# Patient Record
Sex: Female | Born: 1935 | Race: White | Hispanic: No | Smoking: Current every day smoker
Health system: Southern US, Community
[De-identification: ages and names within clinical notes are randomized; demographics above are authoritative.]

## PROBLEM LIST (undated history)

## (undated) DIAGNOSIS — M810 Age-related osteoporosis without current pathological fracture: Secondary | ICD-10-CM

## (undated) DIAGNOSIS — F1910 Other psychoactive substance abuse, uncomplicated: Secondary | ICD-10-CM

## (undated) DIAGNOSIS — I10 Essential (primary) hypertension: Secondary | ICD-10-CM

## (undated) DIAGNOSIS — N189 Chronic kidney disease, unspecified: Secondary | ICD-10-CM

## (undated) DIAGNOSIS — J449 Chronic obstructive pulmonary disease, unspecified: Secondary | ICD-10-CM

## (undated) DIAGNOSIS — F329 Major depressive disorder, single episode, unspecified: Secondary | ICD-10-CM

## (undated) DIAGNOSIS — R627 Adult failure to thrive: Secondary | ICD-10-CM

## (undated) DIAGNOSIS — I219 Acute myocardial infarction, unspecified: Secondary | ICD-10-CM

## (undated) DIAGNOSIS — I2510 Atherosclerotic heart disease of native coronary artery without angina pectoris: Secondary | ICD-10-CM

## (undated) DIAGNOSIS — F0280 Dementia in other diseases classified elsewhere without behavioral disturbance: Secondary | ICD-10-CM

## (undated) DIAGNOSIS — G309 Alzheimer's disease, unspecified: Secondary | ICD-10-CM

## (undated) DIAGNOSIS — F32A Depression, unspecified: Secondary | ICD-10-CM

## (undated) DIAGNOSIS — E119 Type 2 diabetes mellitus without complications: Secondary | ICD-10-CM

## (undated) DIAGNOSIS — C801 Malignant (primary) neoplasm, unspecified: Secondary | ICD-10-CM

## (undated) DIAGNOSIS — K219 Gastro-esophageal reflux disease without esophagitis: Secondary | ICD-10-CM

## (undated) DIAGNOSIS — D649 Anemia, unspecified: Secondary | ICD-10-CM

## (undated) HISTORY — DX: Age-related osteoporosis without current pathological fracture: M81.0

## (undated) HISTORY — DX: Atherosclerotic heart disease of native coronary artery without angina pectoris: I25.10

## (undated) HISTORY — DX: Major depressive disorder, single episode, unspecified: F32.9

## (undated) HISTORY — DX: Adult failure to thrive: R62.7

## (undated) HISTORY — DX: Alzheimer's disease, unspecified: G30.9

## (undated) HISTORY — DX: Other psychoactive substance abuse, uncomplicated: F19.10

## (undated) HISTORY — DX: Chronic obstructive pulmonary disease, unspecified: J44.9

## (undated) HISTORY — DX: Chronic kidney disease, unspecified: N18.9

## (undated) HISTORY — DX: Depression, unspecified: F32.A

## (undated) HISTORY — DX: Anemia, unspecified: D64.9

## (undated) HISTORY — DX: Essential (primary) hypertension: I10

## (undated) HISTORY — DX: Type 2 diabetes mellitus without complications: E11.9

## (undated) HISTORY — DX: Dementia in other diseases classified elsewhere without behavioral disturbance: F02.80

## (undated) HISTORY — DX: Gastro-esophageal reflux disease without esophagitis: K21.9

## (undated) HISTORY — DX: Malignant (primary) neoplasm, unspecified: C80.1

## (undated) HISTORY — DX: Acute myocardial infarction, unspecified: I21.9

## (undated) HISTORY — PX: HERNIA REPAIR: SHX51

---

## 1998-07-16 ENCOUNTER — Emergency Department (HOSPITAL_COMMUNITY): Admission: EM | Admit: 1998-07-16 | Discharge: 1998-07-16 | Payer: Self-pay | Admitting: Emergency Medicine

## 1998-07-16 ENCOUNTER — Encounter: Payer: Self-pay | Admitting: Emergency Medicine

## 1999-01-05 ENCOUNTER — Encounter: Payer: Self-pay | Admitting: Emergency Medicine

## 1999-01-05 ENCOUNTER — Emergency Department (HOSPITAL_COMMUNITY): Admission: EM | Admit: 1999-01-05 | Discharge: 1999-01-05 | Payer: Self-pay | Admitting: Emergency Medicine

## 2001-09-12 ENCOUNTER — Inpatient Hospital Stay (HOSPITAL_COMMUNITY): Admission: EM | Admit: 2001-09-12 | Discharge: 2001-09-20 | Payer: Self-pay

## 2002-04-13 ENCOUNTER — Inpatient Hospital Stay (HOSPITAL_COMMUNITY): Admission: RE | Admit: 2002-04-13 | Discharge: 2002-04-18 | Payer: Self-pay | Admitting: Orthopaedic Surgery

## 2006-12-13 ENCOUNTER — Ambulatory Visit: Payer: Self-pay | Admitting: Cardiology

## 2006-12-13 ENCOUNTER — Inpatient Hospital Stay (HOSPITAL_COMMUNITY): Admission: EM | Admit: 2006-12-13 | Discharge: 2006-12-18 | Payer: Self-pay | Admitting: Emergency Medicine

## 2008-10-17 ENCOUNTER — Emergency Department (HOSPITAL_COMMUNITY): Admission: EM | Admit: 2008-10-17 | Discharge: 2008-10-17 | Payer: Self-pay | Admitting: Emergency Medicine

## 2009-02-17 ENCOUNTER — Ambulatory Visit: Payer: Self-pay | Admitting: Gastroenterology

## 2009-02-18 ENCOUNTER — Inpatient Hospital Stay (HOSPITAL_COMMUNITY): Admission: EM | Admit: 2009-02-18 | Discharge: 2009-02-21 | Payer: Self-pay | Admitting: Emergency Medicine

## 2009-02-28 ENCOUNTER — Ambulatory Visit: Payer: Self-pay | Admitting: Radiology

## 2009-02-28 ENCOUNTER — Inpatient Hospital Stay (HOSPITAL_COMMUNITY): Admission: EM | Admit: 2009-02-28 | Discharge: 2009-03-05 | Payer: Self-pay | Admitting: Internal Medicine

## 2009-02-28 ENCOUNTER — Encounter: Payer: Self-pay | Admitting: Emergency Medicine

## 2009-03-18 ENCOUNTER — Inpatient Hospital Stay (HOSPITAL_COMMUNITY): Admission: EM | Admit: 2009-03-18 | Discharge: 2009-03-23 | Payer: Self-pay | Admitting: Emergency Medicine

## 2009-03-18 ENCOUNTER — Ambulatory Visit: Payer: Self-pay | Admitting: Internal Medicine

## 2009-03-20 ENCOUNTER — Encounter: Payer: Self-pay | Admitting: Internal Medicine

## 2010-05-27 IMAGING — CR DG CHEST 2V
2 series · 2 of 2 positions shown · non-contrast
Comparison: 02/18/2009

CLINICAL DATA: Wheezing

CHEST - 2 VIEW

[w chest pa]
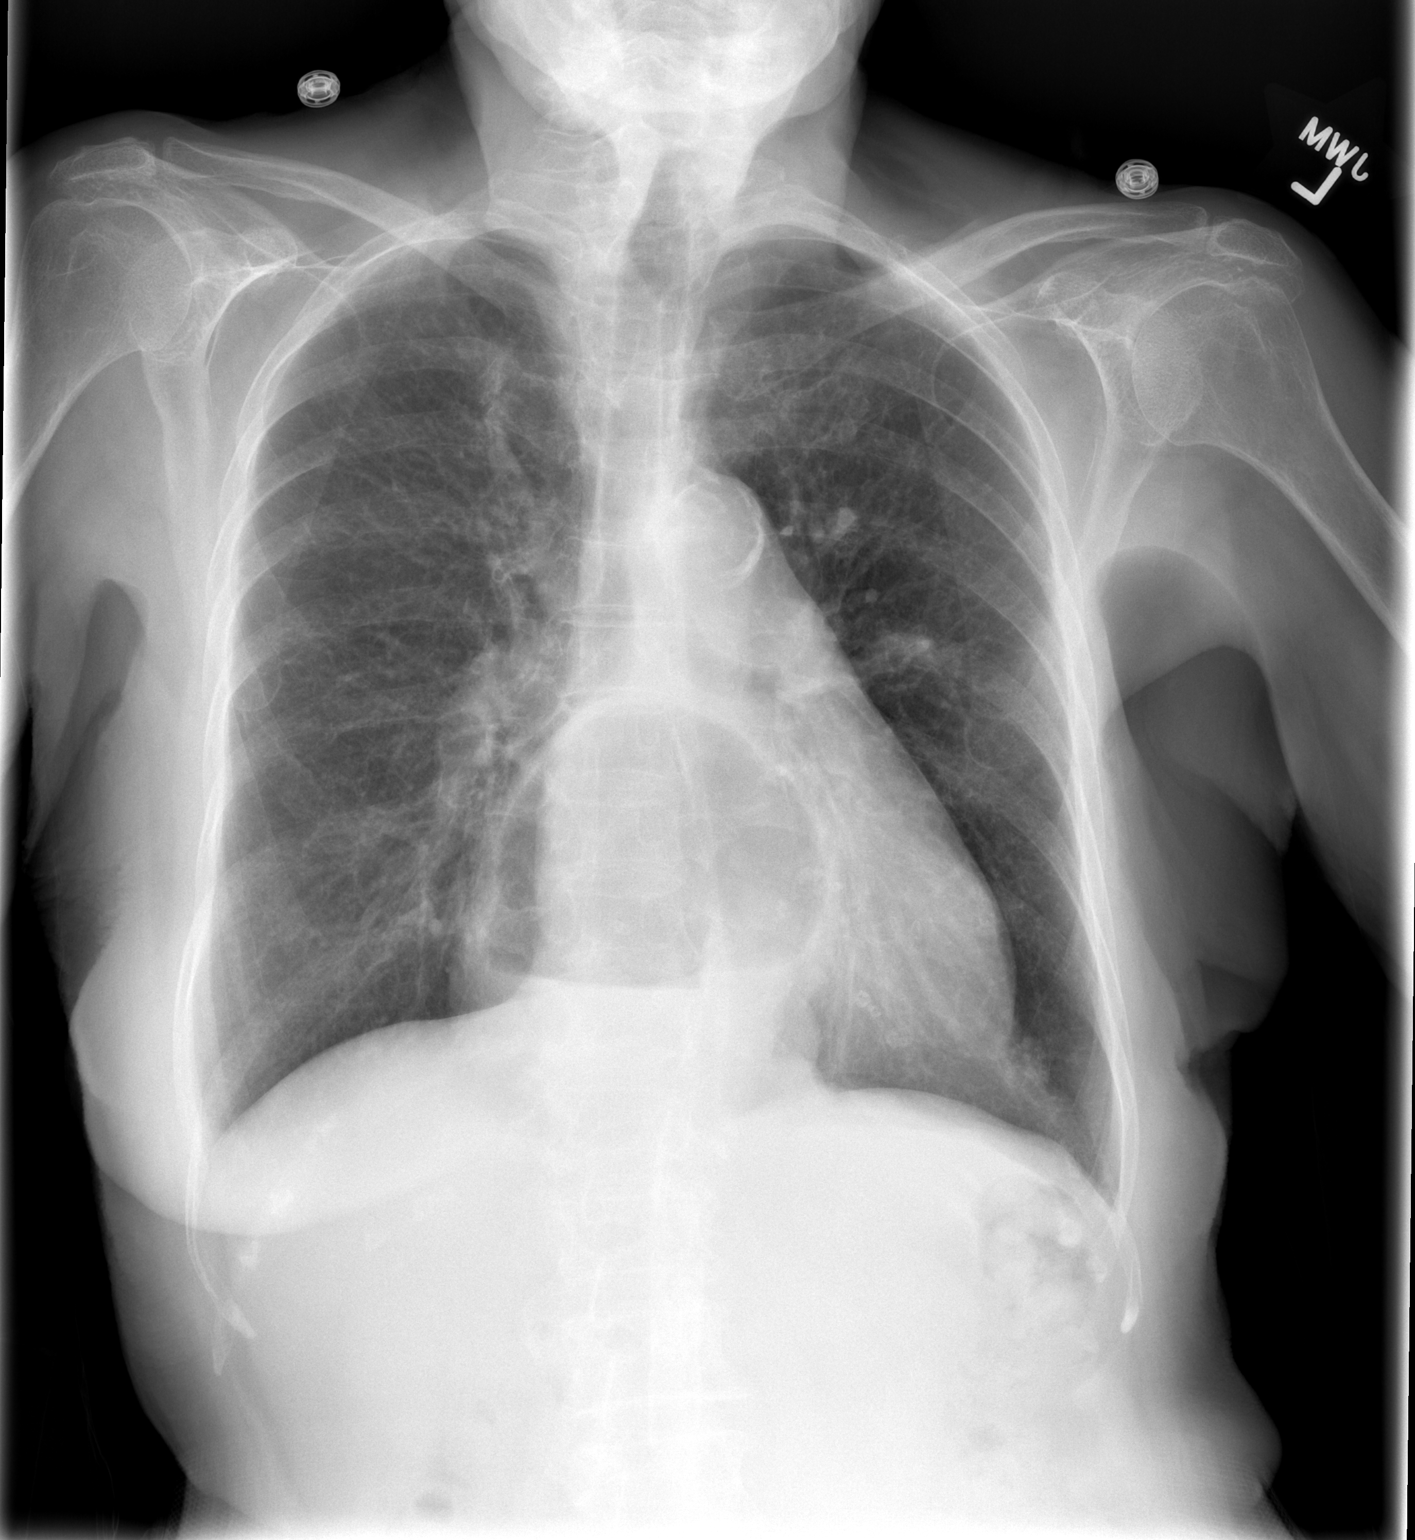

[w chest lat]
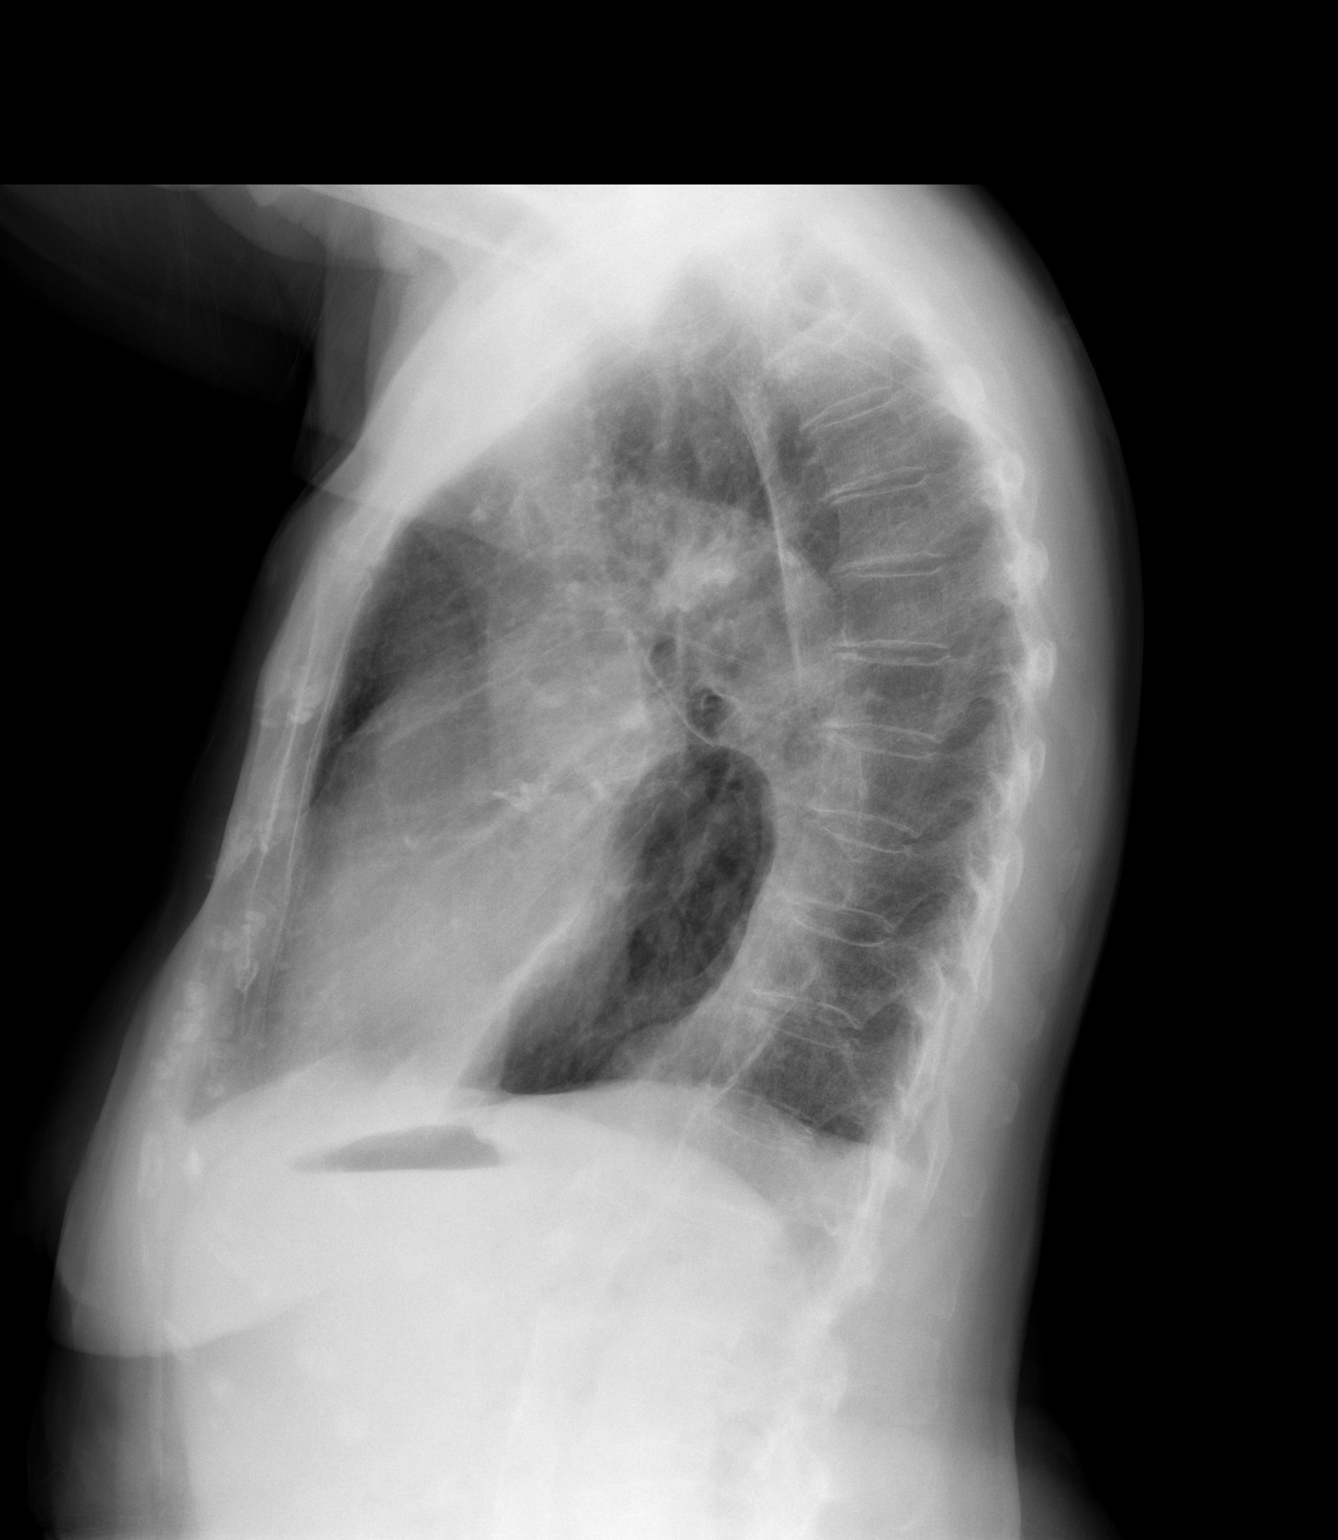

[2 of 2 positions shown; findings below may reference images not displayed]

FINDINGS: Heart size normal.  Changes of COPD noted without pneumonia or
pleural fluid.  Large hiatal hernia is noted with interval increase
in size.

On today's exam, no definite right lower lobe nodule is identified.

There is heavy aortic calcification.  The bones are demineralized
without lesions.
IMPRESSION: 1.  Large hiatal hernia.
2.  COPD with no definite active airspace disease.
3.  Heavy aortic calcification.
4.  Osteopenia.

## 2010-06-01 IMAGING — CR DG CHEST 2V
2 series · 2 of 2 positions shown · non-contrast
Comparison: 02/28/2009

CLINICAL DATA: Cough, shortness of breath, right upper lobe cancer.

CHEST - 2 VIEW

[w chest pa]
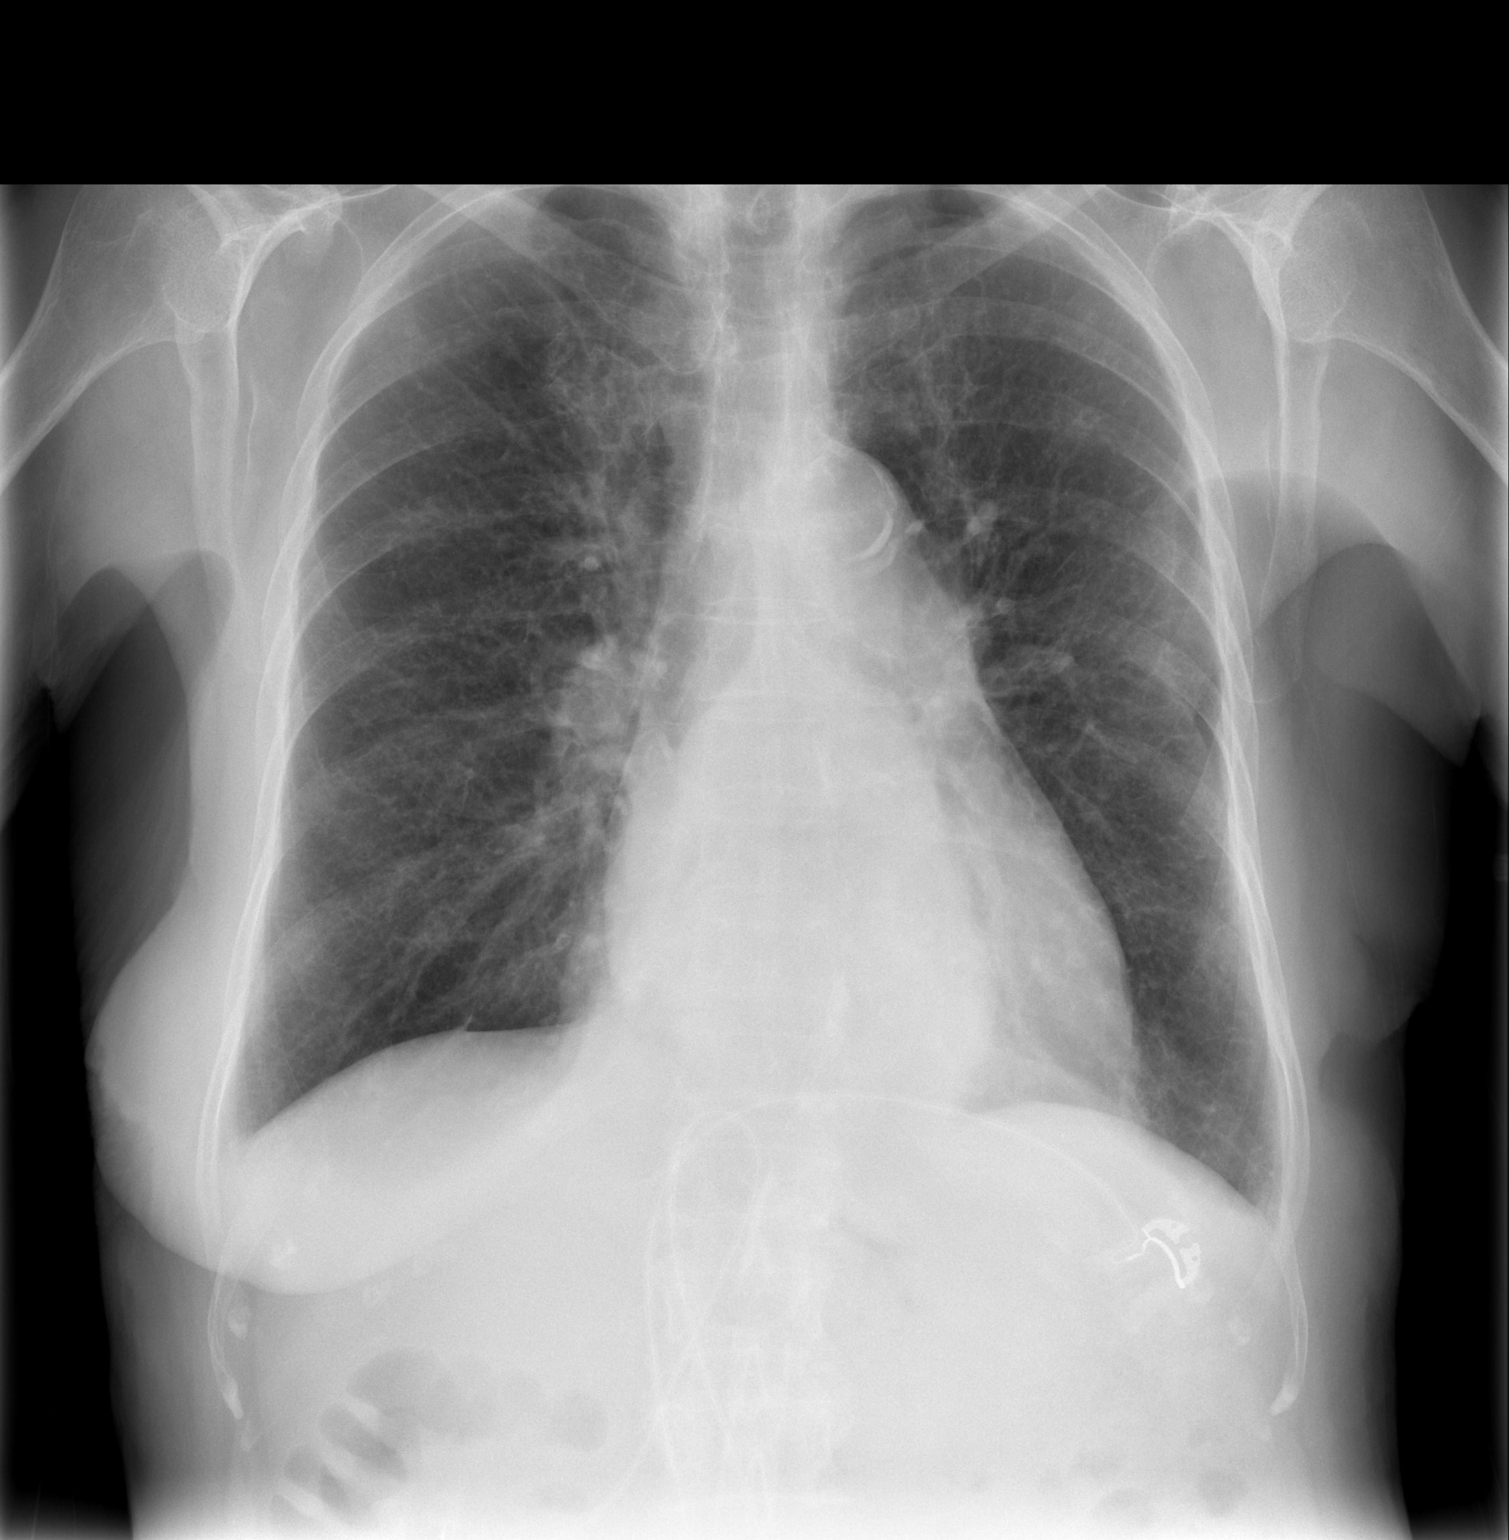

[w chest lat]
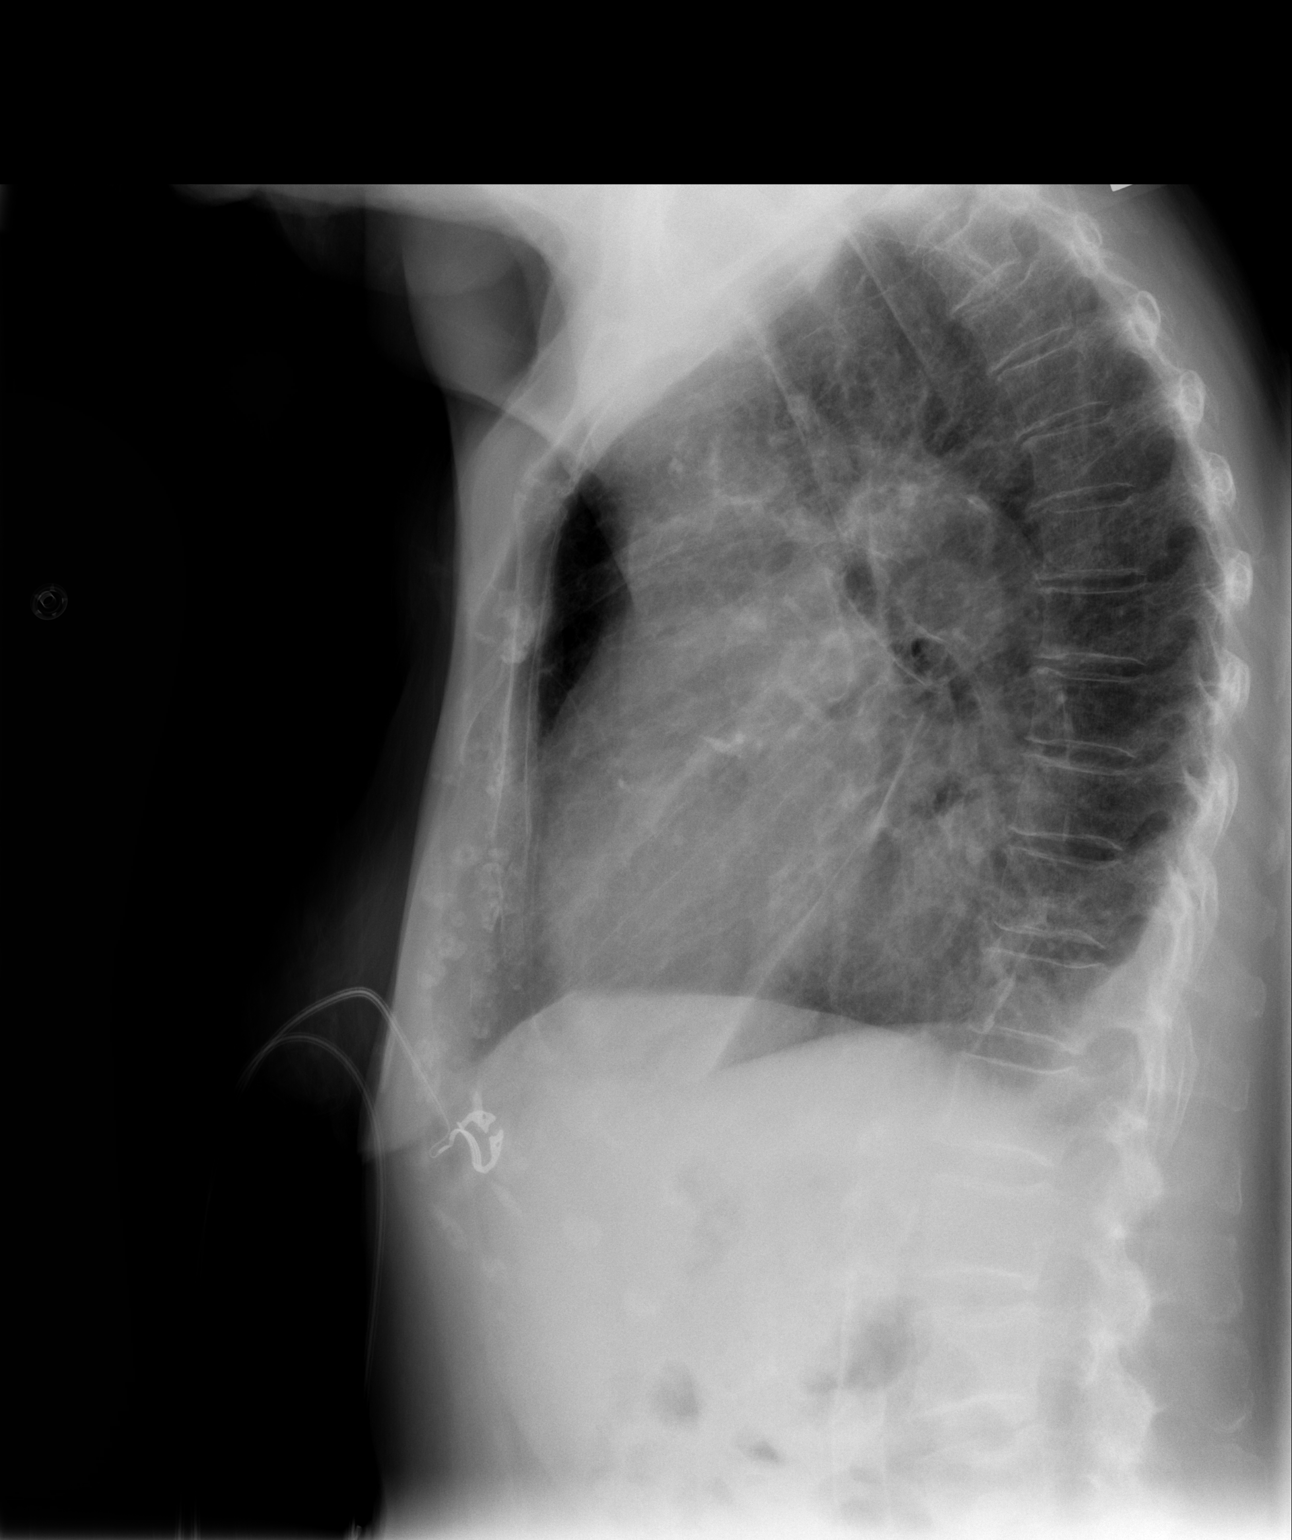

[2 of 2 positions shown; findings below may reference images not displayed]

FINDINGS: Trachea is midline.  Heart size stable.  Thoracic aorta
is calcified.  Large hiatal hernia.  Small right pleural effusion.
COPD.  Lungs appear clear.
IMPRESSION: Small right pleural effusion.

## 2010-06-15 IMAGING — CR DG CHEST 1V PORT
1 series · 1 of 1 positions shown · non-contrast
Comparison: Chest radiograph 03/05/2009

CLINICAL DATA: Upper GI bleed

PORTABLE CHEST - 1 VIEW

[view not recorded]
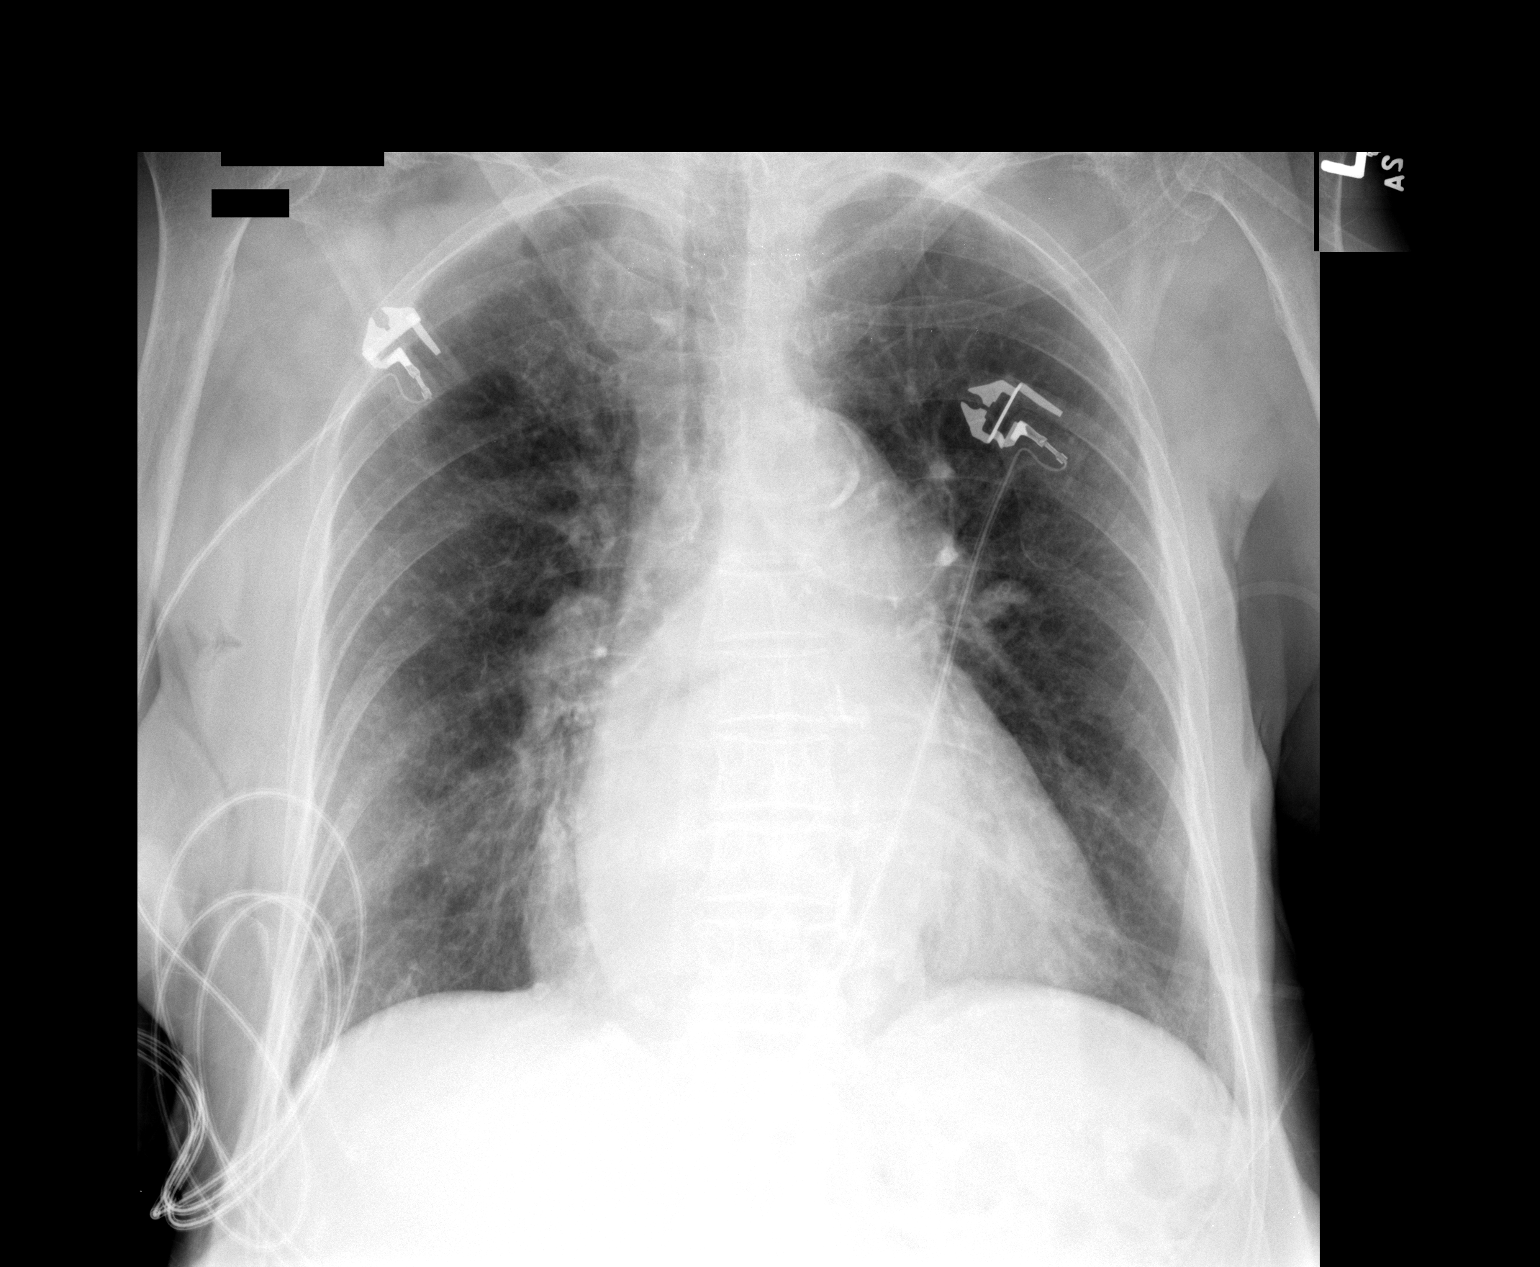

[1 of 1 positions shown; findings below may reference images not displayed]

FINDINGS: Stable enlarged cardiac silhouette.  Costophrenic angles
are clear.  No evidence effusion, infiltrate, or pneumothorax.
Lungs appear hyperinflated.
IMPRESSION: 1.  No acute cardiopulmonary process.
2.  Cardiomegaly.
3.  Hyperinflated lungs.

## 2010-07-13 ENCOUNTER — Inpatient Hospital Stay (HOSPITAL_COMMUNITY)
Admission: EM | Admit: 2010-07-13 | Discharge: 2010-07-15 | Payer: Self-pay | Source: Home / Self Care | Attending: Internal Medicine | Admitting: Internal Medicine

## 2010-07-13 LAB — CBC
Hemoglobin: 9.9 g/dL — ABNORMAL LOW (ref 12.0–15.0)
MCH: 29.5 pg (ref 26.0–34.0)
RBC: 3.36 MIL/uL — ABNORMAL LOW (ref 3.87–5.11)

## 2010-07-13 LAB — COMPREHENSIVE METABOLIC PANEL
ALT: 8 U/L (ref 0–35)
AST: 19 U/L (ref 0–37)
CO2: 27 mEq/L (ref 19–32)
Chloride: 99 mEq/L (ref 96–112)
GFR calc Af Amer: 49 mL/min — ABNORMAL LOW (ref >60)
GFR calc non Af Amer: 40 mL/min — ABNORMAL LOW (ref >60)
Sodium: 134 mEq/L — ABNORMAL LOW (ref 135–145)
Total Bilirubin: 0.4 mg/dL (ref 0.3–1.2)

## 2010-07-13 LAB — URINALYSIS, ROUTINE W REFLEX MICROSCOPIC
Bilirubin Urine: NEGATIVE
Protein, ur: 30 mg/dL — AB
Urobilinogen, UA: 0.2 mg/dL (ref 0.0–1.0)

## 2010-07-13 LAB — DIFFERENTIAL
Basophils Absolute: 0 10*3/uL (ref 0.0–0.1)
Basophils Relative: 1 % (ref 0–1)
Lymphocytes Relative: 18 % (ref 12–46)
Monocytes Relative: 17 % — ABNORMAL HIGH (ref 3–12)
Neutro Abs: 3.2 10*3/uL (ref 1.7–7.7)
Neutrophils Relative %: 61 % (ref 43–77)

## 2010-07-14 LAB — CARDIAC PANEL(CRET KIN+CKTOT+MB+TROPI)
CK, MB: 0.9 ng/mL (ref 0.3–4.0)
CK, MB: 1.8 ng/mL (ref 0.3–4.0)
Relative Index: INVALID (ref 0.0–2.5)
Total CK: 46 U/L (ref 7–177)
Total CK: 72 U/L (ref 7–177)

## 2010-07-14 LAB — PHOSPHORUS: Phosphorus: 3 mg/dL (ref 2.3–4.6)

## 2010-07-14 LAB — MAGNESIUM: Magnesium: 2 mg/dL (ref 1.5–2.5)

## 2010-07-15 DIAGNOSIS — F0280 Dementia in other diseases classified elsewhere without behavioral disturbance: Secondary | ICD-10-CM

## 2010-07-15 HISTORY — DX: Dementia in other diseases classified elsewhere, unspecified severity, without behavioral disturbance, psychotic disturbance, mood disturbance, and anxiety: F02.80

## 2010-07-15 LAB — CBC
Hemoglobin: 9.1 g/dL — ABNORMAL LOW (ref 12.0–15.0)
MCHC: 31.7 g/dL (ref 30.0–36.0)
Platelets: 196 10*3/uL (ref 150–400)

## 2010-07-15 LAB — BASIC METABOLIC PANEL
CO2: 27 mEq/L (ref 19–32)
Calcium: 8.5 mg/dL (ref 8.4–10.5)
GFR calc Af Amer: 56 mL/min — ABNORMAL LOW (ref >60)
GFR calc non Af Amer: 46 mL/min — ABNORMAL LOW (ref >60)
Sodium: 140 mEq/L (ref 135–145)

## 2010-07-16 LAB — URINE CULTURE
Culture  Setup Time: 201201292119
Special Requests: NEGATIVE

## 2010-07-16 NOTE — Discharge Summary (Signed)
NAME:  [name], [name]            ACCOUNT NO.:  [id number]  MEDICAL RECORD NO.:  [id number]          PATIENT TYPE:  INP  LOCATION:  1309                         FACILITY:  [facility]  PHYSICIAN:  Andreas Blower, MD       DATE OF BIRTH:  [birth year]  DATE OF ADMISSION:  07/13/2010 DATE OF DISCHARGE:                        DISCHARGE SUMMARY - REFERRING   PRIMARY CARE PHYSICIAN:  Lenon Curt. Chilton Si, M.D.  DISCHARGE DIAGNOSES: 1. Abdominal pain secondary to constipation and fecal impaction. 2. Fecal impaction resolved after a manual disimpaction. 3. Urinary tract infection. 4. Diabetes diet controlled. 5. Hypertension. 6. Chronic obstructive pulmonary disease. 7. History of coronary artery disease. 8. Dementia. 9. Constipation. 10.History of tobacco use. 11.History of Alzheimer. 12.History of chronic pain. 13.History of diverticular disease. 14.Osteoporosis. 15.Status post open hip reduction for left hip fracture. 16.History of hiatal hernia. 17.Right pelvic mass, most likely a large fibroid.  DISCHARGE MEDICATIONS: 1. Ciprofloxacin 250 mg p.o. twice daily for 3 more days to complete a     5-day course of antibiotics. 2. Morphine sustained release 15 mg p.o. twice daily. 3. Acetaminophen 650 mg every 4 hours as needed for pain. 4. Lactulose 15 mg p.o. q.a.m. 5. Omeprazole 0.5 mg p.o. q.8h. as needed for anxiety. 6. Milk of Magnesia 45 mg p.o. daily at bedtime as needed for     constipation. 7. Mirtazapine 50 mg p.o. daily at bedtime. 8. Memantine 10 mg p.o. twice daily. 9. Ocuvite 1 tablet p.o. q.a.m. 10.Omeprazole 20 mg p.o. q.a.m. 11.Senna-S 8.6/50 two to three tablets twice daily, 2 tablets in the     morning and 3 tablets at bedtime. 12.Systane Ultra 1 drop both eyes 3 times a day.  BRIEF ADMITTING HISTORY AND PHYSICAL:  Ms. Baldridge is an 77 year old Caucasian female with multiple medical conditions presenting to the ED complaining of abdominal pain from [facility].  RADIOLOGY/IMAGING:  The patient had a CT of the abdomen and pelvis with contrast which showed fecal impaction with massive distention of the rectum.  Large hiatal hernia/intrathoracic stomach.  Intra and extra hepatic biliary ductal dilatation without definite obstructing stone or mass seen by CT.  Right pelvic mass, favor a large fibroid.  LABORATORY DATA:  CBC shows a white count of 3.0, hemoglobin 9.1, and hematocrit 28.7.  Electrolytes normal with a creatinine of 1.1, troponin negative x3.  UA was positive for nitrites and had large leukocytes and many bacteria.  Troponins are negative x3.  Lipase was 19.  Liver function tests were normal.  HOSPITAL COURSE BY PROBLEM: 1. Abdominal pain, most likely secondary to constipation and fecal     impaction, resolved during the course hospital stay. 2. Fecal impaction.  The patient had a manual disimpaction done in the     ER and has had good bowel movement since then. 3. Urinary tract infection.  The patient initially was started on     ceftriaxone which was transitioned to ciprofloxacin which she will     continue for 3 more days to complete a 5-day course of antibiotics. 4. Diabetes diet controlled. 5. Right pelvic mass, most likely due to fibroid.  Could consider  nonemergent transvaginal ultrasound, but we will defer to her     primary care physician. 6. Hypertension, stable. 7. COPD, stable during the course of hospital stay. 8. History of coronary artery disease.  Coronary artery disease was     not active issue during the course of the hospital stay. 9. Dementia was stable during the course of the hospital stay. 10.Stool softeners.  The patient was continued on some stool     softeners. 11.History of tobacco use.  The patient was placed on nicotine patch     during the course of the hospital stay. 12.Chronic pain.  During the course of the hospital stay, the     patient's long acting morphine was decreased from 30 mg  twice daily     to 15 mg twice daily.  Further titration of pain medications to be     done as an outpatient.  When I examined the patient, she did not     appear to be in any pain.  Given the patient has constipation, we     will defer to her primary care physician on titrating her pain     medications.  Time spent on discharge talking to the patient and coordinating care was 25 minutes.     Andreas Blower, MD     SR/MEDQ  D:  07/15/2010  T:  07/15/2010  Job:  [id number]  Electronically Signed by Wardell Heath Wanita Derenzo  on 07/16/2010 10:59:57 PM

## 2010-07-20 LAB — CULTURE, BLOOD (ROUTINE X 2): Culture  Setup Time: 201201291422

## 2010-08-22 NOTE — H&P (Signed)
NAME:  [name], [name]            ACCOUNT NO.:  [id number]  MEDICAL RECORD NO.:  [id number]          PATIENT TYPE:  INP  LOCATION:  1309                         [facility]ACILITY:  [facility]  PHYSICIAN:  Lonia Blood, M.D.      DATE OF BIRTH:  1915/09/22  DATE OF ADMISSION:  07/13/2010 DATE OF DISCHARGE:                             HISTORY & PHYSICAL   PRIMARY CARE PHYSICIAN:  Lenon Curt. Chilton Si, MD  PRESENTING COMPLAINT:  Abdominal pain.  HISTORY OF PRESENT ILLNESS:  The patient is a 77 year old female with multiple medical problems who presented to the ED with abdominal pain. She came from [facility].  Pain is said to be midepigastric, has a lot of air in the stomach.  She denied any shortness of breath or chest pain.  She has some fever and mild chills at some point.  Pain is rated as 6/10, more in the epigastric.  She is said to be constipated, but no diarrhea.  No dizziness.  No nausea or vomiting.  The patient was seen in the ED and seems to have acute UTI otherwise.  PAST MEDICAL HISTORY:  Significant for, 1. Well-known hiatal hernia. 2. GI bleed. 3. GERD. 4. Diabetes. 5. Hypertension. 6. Alzheimer dementia. 7. Chronic anemia. 8. History of breast cancer. 9. COPD. 10.History of chronic pain. 11.History of constipation. 12.Diverticular disease. 13.History of coronary artery disease. 14.Osteoporosis. 15.Prior history of urinary tract infection. 16.Status post open hip reduction from left hip fracture. 17.History of respiratory failure, due to hypercapnia. 18.She is a DNR. 19.Tobacco abuse.  ALLERGIES:  ASPIRIN.  CURRENT MEDICATIONS: 1. Senokot-S 2 tablets every morning. 2. Enulose 15 mL daily. 3. Namenda 10 mg twice a day. 4. MS Contin 30 mg twice a day. 5. Remeron 15 mg nightly. 6. Ocuvite as needed. 7. Atrovent as needed. 8. Percocet as needed. 9. Zofran 4 mg p.r.n.  SOCIAL HISTORY:  The patient currently lives at [facility].  She is DNR.  She gets  total care.  No tobacco, alcohol, or IV drug use.  FAMILY HISTORY:  Noncontributory.  REVIEW OF SYSTEMS:  All systems reviewed are negative except per HPI.  PHYSICAL EXAMINATION:  VITAL SIGNS:  Temperature 100.3 rectally, blood pressure 105/65 with a pulse of 72, respiratory rate 19, saturations 96% on room air. GENERAL:  She is awake, alert, oriented.  She is in no acute distress. HEENT:  PERRLA.  EOMI.  No pallor.  No jaundice.  No rhinorrhea. NECK:  Supple.  No JVD.  No lymphadenopathy. RESPIRATORY:  She has good air entry bilaterally.  No wheezing.  No rales.  No crackles. CARDIOVASCULAR SYSTEM:  S1 and S2.  No murmur. ABDOMEN:  Soft, full, nontender with positive bowel sounds. RECTAL:  She is impacted.  Manual disimpaction was performed in the ED. EXTREMITIES:  No edema, cyanosis, or clubbing. SKIN:  No rash.  No ulcers.  LABORATORY DATA:  White count is 5.2, hemoglobin 9.9, platelet count of 205 with some monocytosis.  Sodium 134, potassium 4.5, chloride 99, CO2 of 27, glucose 120, BUN 22, creatinine 1.25.  Alkaline phosphatase 27, calcium 9.4, the rest of the LFTs within normal.  Lipase is 90. Urinalysis showed cloudy urine with small blood, positive nitrite, and large leukocyte esterase.  Urine WBC too numerous to count, RBCs too numerous to count.  Initial cardiac enzymes are negative.  CT abdomen and pelvis shows fecal impaction with massive distention of the rectum. There is also secondary distention of the urinary bladder.  There is a large hiatal hernia or intrathoracic stomach.  Internal and extrahepatic biliary duct dilatation without definite obstruction, mass, or stone. There is a right pelvic mass, probably large fibroid.  ASSESSMENT:  This a 77 year old female presenting with urinary tract infection as well as fecal impaction.  She seems to have some mild acute renal failure, in the setting of diabetes, hypertension, and gastroesophageal reflux  disease.  PLAN: 1. UTI.  We will admit the patient, start on empiric Rocephin IV.  Get     urine cultures, blood cultures.  Once she is much more stabilized,     we will discharge her on oral antibiotics. 2. Fecal impaction.  She has had manual disimpaction in the ED.  We     will continue with scheduled laxatives in the hospital. 3. Diabetes, mainly diet controlled.  Continue with that. 4. Hypertension, blood pressure seems reasonably controlled on home     regimen.  We will continue with that. 5. COPD.  I will put her on empiric nebulizers while here. 6. History of coronary artery disease which continuous.  We will cycle     her enzymes, but patient's symptoms seemed to be unrelated. 7. Dementia.  She is pleasantly demented, but not having any acute     delirium.     Lonia Blood, M.D.     Verlin Grills  D:  07/14/2010  T:  07/14/2010  Job:  [id number]  Electronically Signed by Lonia Blood M.D. on 08/21/2010 04:12:57 PM

## 2010-09-19 LAB — BASIC METABOLIC PANEL
BUN: 19 mg/dL (ref 6–23)
BUN: 6 mg/dL (ref 6–23)
BUN: 9 mg/dL (ref 6–23)
CO2: 25 mEq/L (ref 19–32)
Calcium: 8.2 mg/dL — ABNORMAL LOW (ref 8.4–10.5)
Chloride: 104 mEq/L (ref 96–112)
Creatinine, Ser: 0.81 mg/dL (ref 0.4–1.2)
Creatinine, Ser: 0.85 mg/dL (ref 0.4–1.2)
GFR calc non Af Amer: >60 mL/min (ref >60)
Glucose, Bld: 113 mg/dL — ABNORMAL HIGH (ref 70–99)
Glucose, Bld: 97 mg/dL (ref 70–99)
Glucose, Bld: 98 mg/dL (ref 70–99)
Potassium: 3.2 mEq/L — ABNORMAL LOW (ref 3.5–5.1)
Potassium: 3.4 mEq/L — ABNORMAL LOW (ref 3.5–5.1)
Sodium: 142 mEq/L (ref 135–145)

## 2010-09-19 LAB — HEMOGLOBIN AND HEMATOCRIT, BLOOD
HCT: 31.4 % — ABNORMAL LOW (ref 36.0–46.0)
HCT: 34 % — ABNORMAL LOW (ref 36.0–46.0)
HCT: 36 % (ref 36.0–46.0)
Hemoglobin: 11.2 g/dL — ABNORMAL LOW (ref 12.0–15.0)
Hemoglobin: 11.3 g/dL — ABNORMAL LOW (ref 12.0–15.0)
Hemoglobin: 11.6 g/dL — ABNORMAL LOW (ref 12.0–15.0)
Hemoglobin: 11.8 g/dL — ABNORMAL LOW (ref 12.0–15.0)
Hemoglobin: 12.1 g/dL (ref 12.0–15.0)
Hemoglobin: 12.3 g/dL (ref 12.0–15.0)
Hemoglobin: 12.3 g/dL (ref 12.0–15.0)

## 2010-09-19 LAB — CBC
HCT: 33 % — ABNORMAL LOW (ref 36.0–46.0)
HCT: 36.8 % (ref 36.0–46.0)
Hemoglobin: 10.9 g/dL — ABNORMAL LOW (ref 12.0–15.0)
Hemoglobin: 12.1 g/dL (ref 12.0–15.0)
MCHC: 32.7 g/dL (ref 30.0–36.0)
MCHC: 33.9 g/dL (ref 30.0–36.0)
MCV: 93.2 fL (ref 78.0–100.0)
MCV: 93.7 fL (ref 78.0–100.0)
Platelets: 183 10*3/uL (ref 150–400)
Platelets: 188 10*3/uL (ref 150–400)
Platelets: 198 10*3/uL (ref 150–400)
RBC: 3.53 MIL/uL — ABNORMAL LOW (ref 3.87–5.11)
RDW: 14.1 % (ref 11.5–15.5)
RDW: 14.6 % (ref 11.5–15.5)
WBC: 8.4 10*3/uL (ref 4.0–10.5)

## 2010-09-19 LAB — GLUCOSE, CAPILLARY
Glucose-Capillary: 104 mg/dL — ABNORMAL HIGH (ref 70–99)
Glucose-Capillary: 108 mg/dL — ABNORMAL HIGH (ref 70–99)
Glucose-Capillary: 110 mg/dL — ABNORMAL HIGH (ref 70–99)
Glucose-Capillary: 116 mg/dL — ABNORMAL HIGH (ref 70–99)
Glucose-Capillary: 118 mg/dL — ABNORMAL HIGH (ref 70–99)
Glucose-Capillary: 122 mg/dL — ABNORMAL HIGH (ref 70–99)
Glucose-Capillary: 124 mg/dL — ABNORMAL HIGH (ref 70–99)
Glucose-Capillary: 175 mg/dL — ABNORMAL HIGH (ref 70–99)
Glucose-Capillary: 86 mg/dL (ref 70–99)
Glucose-Capillary: 87 mg/dL (ref 70–99)
Glucose-Capillary: 92 mg/dL (ref 70–99)
Glucose-Capillary: 94 mg/dL (ref 70–99)
Glucose-Capillary: 94 mg/dL (ref 70–99)
Glucose-Capillary: 95 mg/dL (ref 70–99)
Glucose-Capillary: 96 mg/dL (ref 70–99)
Glucose-Capillary: 96 mg/dL (ref 70–99)
Glucose-Capillary: 97 mg/dL (ref 70–99)
Glucose-Capillary: 97 mg/dL (ref 70–99)
Glucose-Capillary: 99 mg/dL (ref 70–99)

## 2010-09-19 LAB — DIFFERENTIAL
Eosinophils Absolute: 0 10*3/uL (ref 0.0–0.7)
Eosinophils Relative: 0 % (ref 0–5)
Lymphs Abs: 0.4 10*3/uL — ABNORMAL LOW (ref 0.7–4.0)
Monocytes Relative: 4 % (ref 3–12)

## 2010-09-19 LAB — COMPREHENSIVE METABOLIC PANEL
ALT: 12 U/L (ref 0–35)
AST: 22 U/L (ref 0–37)
Albumin: 3.4 g/dL — ABNORMAL LOW (ref 3.5–5.2)
Albumin: 3.5 g/dL (ref 3.5–5.2)
BUN: 15 mg/dL (ref 6–23)
Calcium: 7.9 mg/dL — ABNORMAL LOW (ref 8.4–10.5)
Creatinine, Ser: 1.01 mg/dL (ref 0.4–1.2)
GFR calc Af Amer: >60 mL/min (ref >60)
Glucose, Bld: 120 mg/dL — ABNORMAL HIGH (ref 70–99)
Glucose, Bld: 211 mg/dL — ABNORMAL HIGH (ref 70–99)
Sodium: 136 mEq/L (ref 135–145)
Total Protein: 6.1 g/dL (ref 6.0–8.3)
Total Protein: 6.2 g/dL (ref 6.0–8.3)

## 2010-09-19 LAB — TYPE AND SCREEN
ABO/RH(D): A NEG
Antibody Screen: NEGATIVE

## 2010-09-19 LAB — TROPONIN I: Troponin I: 0.02 ng/mL (ref 0.00–0.06)

## 2010-09-19 LAB — HEMOCCULT GUIAC POC 1CARD (OFFICE): Fecal Occult Bld: NEGATIVE

## 2010-09-19 LAB — MAGNESIUM: Magnesium: 1.5 mg/dL (ref 1.5–2.5)

## 2010-09-19 LAB — CK TOTAL AND CKMB (NOT AT ARMC)
Relative Index: INVALID (ref 0.0–2.5)
Total CK: 40 U/L (ref 7–177)

## 2010-09-19 LAB — CARDIAC PANEL(CRET KIN+CKTOT+MB+TROPI)
CK, MB: 2.3 ng/mL (ref 0.3–4.0)
Troponin I: 0.02 ng/mL (ref 0.00–0.06)

## 2010-09-20 LAB — CBC
HCT: 33.3 % — ABNORMAL LOW (ref 36.0–46.0)
HCT: 30.1 % — ABNORMAL LOW (ref 36.0–46.0)
HCT: 33.2 % — ABNORMAL LOW (ref 36.0–46.0)
HCT: 41.5 % (ref 36.0–46.0)
HCT: 41.9 % (ref 36.0–46.0)
HCT: 30.1 % — ABNORMAL LOW (ref 36.0–46.0)
Hemoglobin: 9.1 g/dL — ABNORMAL LOW (ref 12.0–15.0)
Hemoglobin: 10.9 g/dL — ABNORMAL LOW (ref 12.0–15.0)
Hemoglobin: 13.8 g/dL (ref 12.0–15.0)
Hemoglobin: 9.9 g/dL — ABNORMAL LOW (ref 12.0–15.0)
Hemoglobin: 9.9 g/dL — ABNORMAL LOW (ref 12.0–15.0)
MCHC: 32.8 g/dL (ref 30.0–36.0)
MCHC: 33.4 g/dL (ref 30.0–36.0)
MCHC: 32.8 g/dL (ref 30.0–36.0)
MCHC: 32.8 g/dL (ref 30.0–36.0)
MCHC: 33.1 g/dL (ref 30.0–36.0)
MCHC: 33 g/dL (ref 30.0–36.0)
MCV: 94.2 fL (ref 78.0–100.0)
MCV: 94.9 fL (ref 78.0–100.0)
MCV: 93 fL (ref 78.0–100.0)
MCV: 94.9 fL (ref 78.0–100.0)
MCV: 95.1 fL (ref 78.0–100.0)
MCV: 95.4 fL (ref 78.0–100.0)
Platelets: 193 10*3/uL (ref 150–400)
Platelets: 209 10*3/uL (ref 150–400)
Platelets: 167 K/uL (ref 150–400)
Platelets: 193 10*3/uL (ref 150–400)
Platelets: 193 K/uL (ref 150–400)
Platelets: 209 K/uL (ref 150–400)
RBC: 2.88 MIL/uL — ABNORMAL LOW (ref 3.87–5.11)
RBC: 3.18 MIL/uL — ABNORMAL LOW (ref 3.87–5.11)
RBC: 3.49 MIL/uL — ABNORMAL LOW (ref 3.87–5.11)
RBC: 4.46 MIL/uL (ref 3.87–5.11)
RBC: 3.16 MIL/uL — ABNORMAL LOW (ref 3.87–5.11)
RDW: 15.3 % (ref 11.5–15.5)
RDW: 14.2 % (ref 11.5–15.5)
RDW: 15 % (ref 11.5–15.5)
RDW: 15.1 % (ref 11.5–15.5)
RDW: 14.2 % (ref 11.5–15.5)
WBC: 16.2 10*3/uL — ABNORMAL HIGH (ref 4.0–10.5)
WBC: 12.4 K/uL — ABNORMAL HIGH (ref 4.0–10.5)
WBC: 12.6 10*3/uL — ABNORMAL HIGH (ref 4.0–10.5)
WBC: 7.8 K/uL (ref 4.0–10.5)
WBC: 4.3 K/uL (ref 4.0–10.5)

## 2010-09-20 LAB — POCT I-STAT 3, VENOUS BLOOD GAS (G3P V)
Acid-Base Excess: 7 mmol/L — ABNORMAL HIGH (ref 0.0–2.0)
Acid-Base Excess: 8 mmol/L — ABNORMAL HIGH (ref 0.0–2.0)
Bicarbonate: 35.7 meq/L — ABNORMAL HIGH (ref 20.0–24.0)
Bicarbonate: 36.4 meq/L — ABNORMAL HIGH (ref 20.0–24.0)
O2 Saturation: 27 %
O2 Saturation: 38 %
Patient temperature: 37
Patient temperature: 37
TCO2: 38 mmol/L (ref 0–100)
TCO2: 39 mmol/L (ref 0–100)
pCO2, Ven: 73.6 mmHg (ref 45.0–50.0)
pCO2, Ven: 80.1 mmHg (ref 45.0–50.0)
pH, Ven: 7.256 (ref 7.250–7.300)
pH, Ven: 7.302 — ABNORMAL HIGH (ref 7.250–7.300)
pO2, Ven: 21 mmHg — CL (ref 30.0–45.0)
pO2, Ven: 27 mmHg — CL (ref 30.0–45.0)

## 2010-09-20 LAB — GLUCOSE, CAPILLARY
Glucose-Capillary: 105 mg/dL — ABNORMAL HIGH (ref 70–99)
Glucose-Capillary: 109 mg/dL — ABNORMAL HIGH (ref 70–99)
Glucose-Capillary: 116 mg/dL — ABNORMAL HIGH (ref 70–99)
Glucose-Capillary: 127 mg/dL — ABNORMAL HIGH (ref 70–99)
Glucose-Capillary: 129 mg/dL — ABNORMAL HIGH (ref 70–99)
Glucose-Capillary: 138 mg/dL — ABNORMAL HIGH (ref 70–99)
Glucose-Capillary: 139 mg/dL — ABNORMAL HIGH (ref 70–99)
Glucose-Capillary: 155 mg/dL — ABNORMAL HIGH (ref 70–99)
Glucose-Capillary: 164 mg/dL — ABNORMAL HIGH (ref 70–99)
Glucose-Capillary: 260 mg/dL — ABNORMAL HIGH (ref 70–99)
Glucose-Capillary: 78 mg/dL (ref 70–99)
Glucose-Capillary: 97 mg/dL (ref 70–99)

## 2010-09-20 LAB — BLOOD GAS, ARTERIAL
Bicarbonate: 26.3 mEq/L — ABNORMAL HIGH (ref 20.0–24.0)
Drawn by: 30575
Expiratory PAP: 5
FIO2: 0.4 %
FIO2: 0.4 %
O2 Saturation: 97.8 %
PEEP: 5 cmH2O
Patient temperature: 98.6
Patient temperature: 98.6
Pressure support: 10 cmH2O
pH, Arterial: 7.288 — ABNORMAL LOW (ref 7.350–7.400)
pO2, Arterial: 112 mmHg — ABNORMAL HIGH (ref 80.0–100.0)
pO2, Arterial: 124 mmHg — ABNORMAL HIGH (ref 80.0–100.0)

## 2010-09-20 LAB — COMPREHENSIVE METABOLIC PANEL
ALT: 12 U/L (ref 0–35)
ALT: 13 U/L (ref 0–35)
AST: 26 U/L (ref 0–37)
AST: 26 U/L (ref 0–37)
AST: 47 U/L — ABNORMAL HIGH (ref 0–37)
Albumin: 2.8 g/dL — ABNORMAL LOW (ref 3.5–5.2)
Albumin: 2.9 g/dL — ABNORMAL LOW (ref 3.5–5.2)
Albumin: 4.3 g/dL (ref 3.5–5.2)
Alkaline Phosphatase: 18 U/L — ABNORMAL LOW (ref 39–117)
BUN: 18 mg/dL (ref 6–23)
Calcium: 8.3 mg/dL — ABNORMAL LOW (ref 8.4–10.5)
Calcium: 8.6 mg/dL (ref 8.4–10.5)
Chloride: 96 mEq/L (ref 96–112)
Creatinine, Ser: 1.44 mg/dL — ABNORMAL HIGH (ref 0.4–1.2)
Creatinine, Ser: 1.38 mg/dL — ABNORMAL HIGH (ref 0.4–1.2)
GFR calc Af Amer: 41 mL/min — ABNORMAL LOW (ref >60)
GFR calc Af Amer: 53 mL/min — ABNORMAL LOW (ref >60)
GFR calc Af Amer: 43 mL/min — ABNORMAL LOW (ref >60)
Glucose, Bld: 139 mg/dL — ABNORMAL HIGH (ref 70–99)
Potassium: 4 mEq/L (ref 3.5–5.1)
Potassium: 3.8 mEq/L (ref 3.5–5.1)
Sodium: 139 mEq/L (ref 135–145)
Sodium: 142 mEq/L (ref 135–145)
Total Protein: 5.3 g/dL — ABNORMAL LOW (ref 6.0–8.3)
Total Protein: 8.4 g/dL — ABNORMAL HIGH (ref 6.0–8.3)

## 2010-09-20 LAB — DIFFERENTIAL
Basophils Absolute: 0.1 K/uL (ref 0.0–0.1)
Basophils Absolute: 0 K/uL (ref 0.0–0.1)
Basophils Relative: 0 % (ref 0–1)
Basophils Relative: 1 % (ref 0–1)
Basophils Relative: 1 % (ref 0–1)
Eosinophils Absolute: 0 10*3/uL (ref 0.0–0.7)
Eosinophils Absolute: 0 10*3/uL (ref 0.0–0.7)
Eosinophils Absolute: 0.1 K/uL (ref 0.0–0.7)
Eosinophils Absolute: 0.2 K/uL (ref 0.0–0.7)
Eosinophils Relative: 0 % (ref 0–5)
Eosinophils Relative: 0 % (ref 0–5)
Eosinophils Relative: 0 % (ref 0–5)
Eosinophils Relative: 2 % (ref 0–5)
Eosinophils Relative: 5 % (ref 0–5)
Lymphocytes Relative: 3 % — ABNORMAL LOW (ref 12–46)
Lymphocytes Relative: 26 % (ref 12–46)
Lymphocytes Relative: 6 % — ABNORMAL LOW (ref 12–46)
Lymphocytes Relative: 6 % — ABNORMAL LOW (ref 12–46)
Lymphocytes Relative: 29 % (ref 12–46)
Lymphs Abs: 0.4 10*3/uL — ABNORMAL LOW (ref 0.7–4.0)
Lymphs Abs: 0.5 10*3/uL — ABNORMAL LOW (ref 0.7–4.0)
Lymphs Abs: 0.8 10*3/uL (ref 0.7–4.0)
Lymphs Abs: 2 K/uL (ref 0.7–4.0)
Lymphs Abs: 1.3 K/uL (ref 0.7–4.0)
Monocytes Absolute: 0.4 10*3/uL (ref 0.1–1.0)
Monocytes Absolute: 0.4 10*3/uL (ref 0.1–1.0)
Monocytes Absolute: 0.5 10*3/uL (ref 0.1–1.0)
Monocytes Absolute: 0.9 K/uL (ref 0.1–1.0)
Monocytes Absolute: 0.7 K/uL (ref 0.1–1.0)
Monocytes Relative: 3 % (ref 3–12)
Monocytes Relative: 3 % (ref 3–12)
Monocytes Relative: 11 % (ref 3–12)
Monocytes Relative: 4 % (ref 3–12)
Monocytes Relative: 16 % — ABNORMAL HIGH (ref 3–12)
Neutro Abs: 10.5 10*3/uL — ABNORMAL HIGH (ref 1.7–7.7)
Neutro Abs: 11.4 10*3/uL — ABNORMAL HIGH (ref 1.7–7.7)
Neutro Abs: 4.7 K/uL (ref 1.7–7.7)
Neutro Abs: 2.1 K/uL (ref 1.7–7.7)
Neutrophils Relative %: 95 % — ABNORMAL HIGH (ref 43–77)
Neutrophils Relative %: 61 % (ref 43–77)
Neutrophils Relative %: 88 % — ABNORMAL HIGH (ref 43–77)
Neutrophils Relative %: 50 % (ref 43–77)

## 2010-09-20 LAB — URINE MICROSCOPIC-ADD ON

## 2010-09-20 LAB — POCT CARDIAC MARKERS
CKMB, poc: 1.1 ng/mL (ref 1.0–8.0)
CKMB, poc: 1.2 ng/mL (ref 1.0–8.0)
Myoglobin, poc: 65.6 ng/mL (ref 12–200)
Myoglobin, poc: 73.9 ng/mL (ref 12–200)
Troponin i, poc: <0.05 ng/mL (ref 0.00–0.09)
Troponin i, poc: <0.05 ng/mL (ref 0.00–0.09)

## 2010-09-20 LAB — BASIC METABOLIC PANEL
CO2: 25 mEq/L (ref 19–32)
CO2: 30 mEq/L (ref 19–32)
Calcium: 9.7 mg/dL (ref 8.4–10.5)
Chloride: 104 mEq/L (ref 96–112)
Chloride: 99 mEq/L (ref 96–112)
Creatinine, Ser: 1.7 mg/dL — ABNORMAL HIGH (ref 0.4–1.2)
GFR calc Af Amer: 32 mL/min — ABNORMAL LOW (ref >60)
GFR calc Af Amer: 48 mL/min — ABNORMAL LOW (ref >60)
GFR calc Af Amer: 34 mL/min — ABNORMAL LOW (ref >60)
GFR calc non Af Amer: 28 mL/min — ABNORMAL LOW (ref >60)
Glucose, Bld: 265 mg/dL — ABNORMAL HIGH (ref 70–99)
Glucose, Bld: 132 mg/dL — ABNORMAL HIGH (ref 70–99)
Potassium: 2.7 mEq/L — CL (ref 3.5–5.1)
Sodium: 140 mEq/L (ref 135–145)
Sodium: 144 mEq/L (ref 135–145)
Sodium: 142 mEq/L (ref 135–145)

## 2010-09-20 LAB — URINE CULTURE: Colony Count: 70000

## 2010-09-20 LAB — COMPREHENSIVE METABOLIC PANEL WITH GFR
ALT: <8 U/L (ref 0–35)
AST: 21 U/L (ref 0–37)
Albumin: 2.9 g/dL — ABNORMAL LOW (ref 3.5–5.2)
Alkaline Phosphatase: 14 U/L — ABNORMAL LOW (ref 39–117)
BUN: 17 mg/dL (ref 6–23)
CO2: 25 meq/L (ref 19–32)
Calcium: 7.9 mg/dL — ABNORMAL LOW (ref 8.4–10.5)
Chloride: 112 meq/L (ref 96–112)
Creatinine, Ser: 1.32 mg/dL — ABNORMAL HIGH (ref 0.4–1.2)
GFR calc non Af Amer: 38 mL/min — ABNORMAL LOW
Glucose, Bld: 88 mg/dL (ref 70–99)
Potassium: 3.9 meq/L (ref 3.5–5.1)
Sodium: 143 meq/L (ref 135–145)
Total Bilirubin: 0.6 mg/dL (ref 0.3–1.2)
Total Protein: 5.5 g/dL — ABNORMAL LOW (ref 6.0–8.3)

## 2010-09-20 LAB — CROSSMATCH
ABO/RH(D): A NEG
Antibody Screen: NEGATIVE

## 2010-09-20 LAB — BASIC METABOLIC PANEL WITH GFR
BUN: 36 mg/dL — ABNORMAL HIGH (ref 6–23)
CO2: 37 meq/L — ABNORMAL HIGH (ref 19–32)
Chloride: 100 meq/L (ref 96–112)
Potassium: 4.8 meq/L (ref 3.5–5.1)

## 2010-09-20 LAB — URINALYSIS, ROUTINE W REFLEX MICROSCOPIC
Bilirubin Urine: NEGATIVE
Glucose, UA: NEGATIVE mg/dL
Ketones, ur: 15 mg/dL — AB
Nitrite: NEGATIVE
Protein, ur: 30 mg/dL — AB
Specific Gravity, Urine: 1.015 (ref 1.005–1.030)
Urobilinogen, UA: 0.2 mg/dL (ref 0.0–1.0)
pH: 6.5 (ref 5.0–8.0)

## 2010-09-20 LAB — GASTRIC OCCULT BLOOD (1-CARD TO LAB): Occult Blood, Gastric: POSITIVE — AB

## 2010-09-20 LAB — MAGNESIUM: Magnesium: 2.2 mg/dL (ref 1.5–2.5)

## 2010-09-20 LAB — POTASSIUM: Potassium: 4.8 meq/L (ref 3.5–5.1)

## 2010-09-20 LAB — TYPE AND SCREEN: Antibody Screen: NEGATIVE

## 2010-09-24 LAB — URINALYSIS, ROUTINE W REFLEX MICROSCOPIC
Glucose, UA: NEGATIVE mg/dL
Hgb urine dipstick: NEGATIVE
Ketones, ur: NEGATIVE mg/dL
pH: 6 (ref 5.0–8.0)

## 2010-09-24 LAB — COMPREHENSIVE METABOLIC PANEL
ALT: 9 U/L (ref 0–35)
AST: 25 U/L (ref 0–37)
Albumin: 3.8 g/dL (ref 3.5–5.2)
CO2: 29 mEq/L (ref 19–32)
Chloride: 103 mEq/L (ref 96–112)
Creatinine, Ser: 1.22 mg/dL — ABNORMAL HIGH (ref 0.4–1.2)
GFR calc Af Amer: 50 mL/min — ABNORMAL LOW (ref >60)
GFR calc non Af Amer: 41 mL/min — ABNORMAL LOW (ref >60)
Sodium: 140 mEq/L (ref 135–145)
Total Bilirubin: 0.3 mg/dL (ref 0.3–1.2)

## 2010-10-29 NOTE — Discharge Summary (Signed)
NAMEFARIHA, GOTO            ACCOUNT NO.:  [id number]   MEDICAL RECORD NO.:  [id number]          PATIENT TYPE:  INP   LOCATION:  4713                         FACILITY:  [facility]   PHYSICIAN:  Lonia Blood, M.D.      DATE OF BIRTH:  [birth year]   DATE OF ADMISSION:  12/13/2006  DATE OF DISCHARGE:  12/18/2006                               DISCHARGE SUMMARY   PRIMARY CARE PHYSICIAN:  Unassigned .   DISCHARGE DIAGNOSIS:  1. Non Q-wve myocardial infarction.  2. Hypokalemia.  3. Hyponatremia.  4. Type 2 diabetes.  5. Normocytic anemia  6. Acute on chronic kidney disease.  7. Cecal impaction.  8. Transient proteinuria.  9. Leukocytosis.  10.Dementia.  11.Altered mental status change.  12.Tobacco abuse.   DISCHARGE MEDICATIONS:  1. Toprol-XL 100 mg daily.  2. Protonix 40 mg daily.  3. Reglan 5 mg q 6 hours.  4. Senokot-S one tablet at night.  5. Lexapro 10 mg daily.  6. Namenda 10 mg twice a day.  7. Aricept 10 mg twice a day.  8. Colace 100 mg twice a day.  9. MS Contin 30 mg q 12 hours.   DISPOSITION:  The patient will be transferred to a skilled nursing  facility at [facility].  She will also follow up with Dr. Rollene Rotunda.  The patient will be called with an appointment.   PROCEDURE PERFORMED:  1. Abdominal x-ray on 12/13/2006 shows hiatal hernia.  Also, fecal      impaction without obstructive gas pattern.  2. Left hip x-ray on 12/13/2006 showed expected appearance of total      hip replacement.  3. CT without contrast on 12/13/2006 showed no acute intracranial      findings with moderate cerebral atrophy.   CONSULTATIONS:  Rollene Rotunda, MD, [facility], Cardiology.   HISTORY AND PHYSICAL:  For history and physical, please refer to  dictated history and physical by Dr. Ladell Pier.  In short, however,  this is an 77 year old female who was brought in to the emergency room  secondary to altered mental status change as well as vomiting.  The  patient was also having  generalized pain and aches.  At the time of  admission she had a temperature of 99.5, blood pressure 183/88 with a  pulse of 110.  Her initial labs showed a potassium of 3.4 and creatinine  1.04.  otherwise leukocytes was 14.4.  She was also dehydrated with  hemoglobin 16.1.  There was evidence of obstipation on film.   HOSPITAL COURSE:  1. Altered mental status and obstipation.  This was alter to be found      to a non Q-wave myocardial infarction that the patient turned out      to have.  Her altered mental status change resolved overnight with      treatment.  She seems back to her baseline.  2. Non Q-wave myocardial infarction.  On admission, the patient's      enzymes were checked for the purpose of follow up.  This showed a      CK of 799 with an MB of 37,  index of 4.6 and troponin 0.1.  That      rose to 0.14.  Subsequently Cardiology has been consulted.  The      patient is currently on medical treatment due to her age.  She has      responded to medical management effectively.  At this point she is      only to continue on aspirin and other medications.  3. Hypertension.  Blood pressure has also been controlled on her      current medication regiment.  4. Hypokalemia.  This has been persistent.  The patient should be on      potassium chloride 20 mEq daily henceforth.  5. Diabetes.  The patient's sugars have been, for the most part,      controlled mainly on sliding scale insulin as well as diet.  6. Constipation/obstipation.  The patient's fecal impaction was      disimpacted.  She is currently on a laxative regimen.  7. Anemia.  Her hemoglobin has remained around 11 during this      hospitalization.  She is known to have anemia of chronic disease.      The target is to keep her hemoglobin above 10 due to her MI.  8. Acute on chronic kidney disease.  The patient's creatinine was      above 1, up to 1.5.  It has, however, gradually decreased.  As of      today it is 0.87.  9.  Hyponatremia.  Sodium was also low, up to 130, but has come back to      within normal range at this point.   DISCHARGE LABORATORY DATA:  White count 5.1, hemoglobin 10.9, platelets  198.  Sodium 136, potassium 3.3, chloride 106, C02 28, BUN 10,  creatinine 0.87, glucose 115.      Lonia Blood, M.D.  Electronically Signed     LG/MEDQ  D:  12/18/2006  T:  12/18/2006  Job:  [id number]

## 2010-10-29 NOTE — H&P (Signed)
NAME:  [name], [name]            ACCOUNT NO.:  [id number]   MEDICAL RECORD NO.:  [id number]          PATIENT TYPE:  EMS   LOCATION:  MAJO                         FACILITY:  [facility]   PHYSICIAN:  Ladell Pier, M.D.   DATE OF BIRTH:  February 05, 1916   DATE OF ADMISSION:  DATE OF DISCHARGE:                              HISTORY & PHYSICAL   CHIEF COMPLAINT:  Altered mental status, vomiting x1.   HISTORY OF PRESENT ILLNESS:  The patient is an 77 year old white female,  resident of a nursing home, brought to the emergency room secondary to  decrease in mental status.  Patient complains of hurting all over,  especially in her left hip.  She also had 1 episode of emesis at the  nursing home with decreased p.o. intake.   PAST MEDICAL HISTORY:  Significant for:  1. Dementia.  2. Hypertension.  3. Chronic pain.  4. Alzheimer's dementia.  5. Anemia.  6. History of constipation in the past.  7. Gastroesophageal reflux disease.   FAMILY HISTORY:  Noncontributory.   SOCIAL HISTORY:  Patient resides at nursing home.  No tobacco or alcohol  use.   MEDICATIONS:  1. Avinza 60 mg once daily.  2. Lexapro 10 mg daily.  3. Namenda 10 mg twice daily.  4. Senokot 1 tab twice daily.  5. Calcium with vitamin D 3 times daily.  6. Zantac 150 mg at bedtime.  7. Aricept 10 mg at bedtime.  8. Albuterol p.r.n.   ALLERGIES:  PATIENT ALLERGIC TO:  1. SALSALATE.  2. PERISOL.  3. ANTIPYRETICS.  4. ANALGESIC MEDICATIONS.   REVIEW OF SYSTEMS:  Unable to obtain secondary to patient's decrease in  mental status and dementia.   PHYSICAL EXAMINATION:  VITAL SIGNS:  Temperature 99.5.  Blood pressure  183/88.  Pulse 110.  Respirations 18.  Pulse oximetry 95% on room air.  HEENT:  Head is normocephalic, atraumatic.  Pupils reactive to light.  Throat without erythema.  CARDIOVASCULAR:  Regular rate and rhythm.  LUNGS:  Clear bilaterally.  ABDOMEN:  Soft, nontender, nondistended.  Positive bowel sounds.  EXTREMITIES:  Without edema.   LABORATORY DATA:  Sodium 139, potassium 3.4, chloride 99, CO2 26, BUN  22, creatinine 1.06, glucose 224.  WBC 14.4, hemoglobin 16.1, platelet  250.  A KUB showed stool, lots of stool in the colon.  Head CT is  pending.   ASSESSMENT/PLAN:  1. Altered mental status.  We will evaluate head CT, although most      likely her altered mental status is secondary to her impaction and      dehydration with the nausea and vomiting.  We will replace      potassium, give intravenous fluids and monitor mental status.  2. Hip pain.  We will get x-ray of her hip.  3. Constipation.  We will give her soapsuds enema, intravenous Reglan      and Colace twice daily.  4. Decreased potassium.  Replete.  5. Elevated blood pressure.  We will give her Lopressor p.r.n.  She      has a history of hypertension, but was not on medication for  blood      pressure.  We will also get cardiac enzymes, blood cultures and      urinalysis.  Chest x-ray is benign.  Leukocytosis could be a left      shift secondary to stress reaction.  6. Elevated glucose.  We will monitor and will cover with sliding      scale.  Could also be secondary to stress reaction.      Ladell Pier, M.D.  Electronically Signed     N[place]/MEDQ  D:  12/13/2006  T:  12/14/2006  Job:  [id number]

## 2010-10-29 NOTE — Consult Note (Signed)
NAME:  [name], [name]            ACCOUNT NO.:  [id number]   MEDICAL RECORD NO.:  [id number]          PATIENT TYPE:  INP   LOCATION:  6524                         FACILITY:  [facility]   PHYSICIAN:  Rollene Rotunda, MD, FACCDATE OF BIRTH:  03/20/1916   DATE OF CONSULTATION:  12/14/2006  DATE OF DISCHARGE:                                 CONSULTATION   REFERRING SERVICE:  [facility].   PRIMARY:  Medical laboratory scientific officer.   REASON FOR CONSULTATION:  Evaluate patient with abnormal cardiac  enzymes.   HISTORY OF PRESENT ILLNESS:  The patient is an 77 year old white female  without apparent prior cardiac history.  She lives at a nursing home and  was apparently brought to the emergency room with increased mental  status changes.  The nursing home documentation suggests that she is  usually alert and oriented to self, location and status, but she became  less responsive and very sleepy.  She had some vomiting.  In the  emergency room, she apparently complained of some aches and pains all  over, but I do not see any evidence of localizing this discomfort.  She  was constipated and hypertensive.  She had some hyperglycemia.  She is  being managed and evaluated for this altered mental status.  During the  course of this evaluation, cardiac enzymes were drawn which were  positive with peak CK of 799 and an MB of 37, which is a ratio of 4.6.  Her troponins have been marginally elevated with a peak of 0.14.   On further evaluation, the is unable to answer any questions.  She opens  her eyes and responds, but then falls immediately back asleep.  She  cannot answer any questions about any ongoing pain or shortness of  breath, though she clearly does not appear to be in any distress.  She  is resting comfortably.  I could not contact her son.  There is limited  data in the chart and nothing that mentions any recent cardiac  complaints.   PAST MEDICAL HISTORY:  1. Dementia, Alzheimer's type.  2. Hypertension.  3. Chronic pain.  4. Anemia.  5. Gastroesophageal reflux disease.  6. Constipation.  7. Asthma.  8. Breast cancer.   PAST SURGICAL HISTORY:  1. Left breast mastectomy.  2. Left hip replacement.   ALLERGIES:  Salicylate, antipyretics, analgesics, Pyrazole.   MEDICATIONS:  (At the nursing home.)  1. Avinza 60 mg daily.  2. Lexapro 10 mg daily,.  3. Namenda 10 mg daily.  4. Senokot.  5. Resource.  6. Calcium.  7. Zantac.  8. Aricept.   SOCIAL HISTORY:  The patient lives in a nursing home.  She has at least  1 son.  She is not smoking cigarettes and does not drink alcohol.   FAMILY HISTORY:  I do not see any report of significant early history of  coronary disease.   REVIEW OF SYSTEMS:  Unobtainable from the patient.   PHYSICAL EXAMINATION:  GENERAL:  The patient is lying comfortably in bed  and is in no distress.  VITAL SIGNS:  Blood pressure 177/76, heart rate 98  and regular,  afebrile.  HEENT:  Eyelids unremarkable.  Pupils equal, round and reactive to  light.  Fundi not visualized.  Oral mucosa remarkable.  NECK:  Unable to  assess jugular distension with the patient lying flat, carotid upstroke  brisk and symmetrical.  No bruits, no thyromegaly.  LYMPHATICS:  No cervical, axillary or inguinal adenopathy.  LUNGS:  Clear to auscultation bilaterally.  BACK:  No costovertebral angle tenderness.  CHEST:  Unremarkable.  HEART:  PMI not displaced or sustained, S1 and S2 within normal limits.  No S3, no S4, no murmurs.  ABDOMEN:  Flat.  Positive bowel sounds, normal in frequency and pitch.  No bruits, no rebound, no guarding, no midline pulsatile mass, no  hepatomegaly, no splenomegaly.  SKIN:  No rashes, no nodules.  EXTREMITIES:  2+ pulses throughout, no edema, no cyanosis, no clubbing.  NEUROLOGIC:  The patient is very somnolent and unable to answer any  questions about person, place and time.  Her cranial nerves are grossly  intact, she moves all  her extremities.   EKG:  Sinus rhythm, rate 86, low voltage in the limb leads, nonspecific  anterior T-wave changes.   LABORATORY DATA:  CK is 799, MB 37.7, CK 657, MB 16.3, troponin 0.1 and  0.14.  WBC 14.4, hemoglobin 16.1.  Sodium 139, potassium 3.4, BUN 22 and  creatinine 1.0.   HEAD CT:  No acute intracranial findings.   ASSESSMENT AND PLAN:  1. Non-Q-wave myocardial infarction.  The patient does have elevated      cardiac enzymes with an elevated CK.  The ratio would suggest a non-      Q-wave myocardial infarction.  The troponins, however, are not as      elevated as I would expect for this MB.  At this point, she is not      localizing any symptoms that would point towards a cardiac source.      She certainly is not in any distress from cardiovascular      complications.  Her EKG is nonspecific and does not demonstrate any      acute injury or ischemia pattern obvious.  She has some severe      comorbidities with current significant change in her mental status.      At this point, I would manage her conservatively.  We will check an      echocardiogram to look for any high-grade wall motion abnormalities      or other significant abnormalities consistent with infarct or      ischemia.  She will be given beta blockers, 25 mg of Lopressor q.8      h.  We will need to investigate her aspirin allergy, as she truly      would benefit from this.  Further invasive or noninvasive      evaluation can be based on further enzymes, the results of the      echocardiogram or symptoms, or history as her mental status clears.  2. Hypertension.  This can be managed in the context of medicating her      non-Q-wave myocardial infarction.  3. Risk reduction.  We should consider a statin during this      hospitalization.  4. Followup:  We will follow her during this hospitalization.      Rollene Rotunda, MD, [facility]  Electronically Signed     JH/MEDQ  D:  12/14/2006  T:  12/15/2006  Job:   [id number]

## 2010-11-01 NOTE — H&P (Signed)
[place]. [facility]  Patient:    [name], [name] Visit Number: [id number] MRN: [id number]          Service Type: SUR Location: 5000 [id number] Attending Physician:  Jacki Cones Dictated by:   Genene Churn. Barry Dienes, P.A.-C. Admit Date:  09/12/2001 Discharge Date: 09/20/2001                           History and Physical  CHIEF COMPLAINT:  Left hip pain.  HISTORY OF PRESENT ILLNESS:  [age]-year-old white female who resides at [facility], presented to the [facility] ER for left hip pain.  Patient and her son state that she is 90% wheelchair bound and she attempted to get up and walk in her room, when she fell, landing on her left hip.  Patient was unable to weightbear after this incident.  Denies any numbness and tingling of the lower extremity.  She was brought by ambulance to the ER and x-rays were taken which showed a left intertrochanteric hip fracture.  She was then started on pain medications.  No previous history of hip fracture.  Currently denies any chest pain or shortness of breath.  CURRENT MEDICATIONS:  1. Dalmane 50 mg p.o. q.h.s.  2. Peri-Colace one tab p.o. q.h.s.  3. Aricept 5 mg p.o. every 8 p.m.  4. Clinoril 150 mg p.o. every 5 p.m.  5. Prilosec 20 mg p.o. q.d.  6. Hydrochlorothiazide 25 mg p.o. q.d.  7. K-Tab 10 mEq p.o. q.d.  8. Xanax 0.25 mg p.o. b.i.d.  9. Thorazine 25 mg p.o. t.i.d. 10. Colace 100 mg p.o. b.i.d. 11. Serevent two puffs q.12h. 12. Imodium. 13. Miralax. 14. Suppositories.  ALLERGIES:  ASPIRIN.  PREVIOUS SURGICAL HISTORY AND PAST MEDICAL HISTORY:  1. Dementia.  2. History of ETOH abuse.  3. Status post left breast mastectomy for cancer.  4. History of asthma.  5. Dry eye syndrome.  6. Hypertension.  7. GERD.  FAMILY HISTORY:  Positive for coronary artery disease.  SOCIAL HISTORY:  Patient is a widow and resides at [facility].  Admits smoking  1 pack per day x70 years.  Denies alcohol consumption.  Patient has a son and a 25 year old brother who live here in [place].  REVIEW OF SYSTEMS:  Fourteen-point review of systems positive for history of arthritis, dementia and constipation.  Denies any chest pain, shortness of breath or abdominal pain.  Denies history of diabetes or CAD.  PHYSICAL EXAMINATION:  VITAL SIGNS:  Temperature 98.9, pulse 71, respirations 24, blood pressure 146/64.  GENERAL:  Pleasant, elderly white female who is in no acute distress.  She does have some confusion.  HEENT:  Oral mucosa is pink and moist.  Extraocular muscles are intact.  NECK:  Full range of motion.  Supple and nontender.  Carotids are intact.  No lymphadenopathy.  LUNGS:  CTA bilaterally.  No wheezes, rales or rhonchi noted.  BREASTS:  Left breast was removed from previous mastectomy.  HEART:  Regular rate and rhythm.  Normal S1 and S2.  No murmurs, rubs, or gallops noted.  ABDOMEN:  Round, nondistended.  NABS x4.  Soft and nontender.  No palpable masses or organomegaly.  EXTREMITIES:  On exam, left lower extremity is shortened with some internal rotation.  She has increased pain with logrolling.  Sensory and motor function intact.  She has 1+ pedal pulses on the left, 2+ on the right.  SKIN:  Warm and dry.  LABORATORY AND ACCESSORY DATA:  X-rays showed a left intertrochanteric hip fracture.  Labs:  WBC 12.2, hematocrit 28.1, hemoglobin 11.4, platelets 325,000.  PT 13.2, INR 1.0, PTT 34.  Sodium 142, potassium 4.4, chloride 109, CO2 27, glucose 162, BUN 23, creatinine 1.1, calcium 9.4, albumin 3.3, AST 19, ALT 10, alkaline phosphatase 25, total bilirubin 0.3.  EKG showed normal sinus rhythm with nonspecific T wave abnormality.  UA pending.  Chest x-ray showed no acute disease.  ASSESSMENT:  Left intertrochanteric hip fracture.  PLAN:  ORIF of the left hip.  We will then send the patient to the floor and start her on  IV pain medications.  We will also place her in [place] traction starting at 5 pounds.  Procedure was discussed with patient and her son today. Some of the risks and complications were briefly discussed.  Permit was signed by patients son, who is her power of attorney.  She will have a regular diet tonight with a clear liquid diet in the a.m., then she will be n.p.o. after 0900 hours.  Dictated by:   Genene Churn. Barry Dienes, P.A.-C. Attending Physician:  Jacki Cones DD:  09/12/01 TD:  09/13/01 Job: [id number]

## 2010-11-01 NOTE — Discharge Summary (Signed)
[place]. [facility]  Patient:    [name], [name] Visit Number: [id number] MRN: [id number]          Service Type: SUR Location: 5000 [id number] Attending Physician:  Jacki Cones Admit Date:  09/12/2001 Disc. Date: 09/20/01                             Discharge Summary  FINAL DIAGNOSES:  1. Status post left Gamma nail for intertrochanteric/subtrochanteric hip     fracture.  2. History of dementia.  3. History of asthma.  4. Dry eye syndrome.  5. Hypertension.  6. Long-term use of anticoagulants.  HISTORY OF PRESENT ILLNESS:  An 77 year old white female who presented to the [facility] emergency room on September 12, 2001, after falling at her assisted care living facility.  She was trying to transfer from her chair to the bathroom when she fell landing on her left hip.  She had increased pain and was unable to weightbear after this accident.  X-rays to the left intertrochanteric hip fracture.  ADMISSION LABS:  WBC 12.2, hematocrit 28.1, hemoglobin 11.4, platelets 325. PT 13.2, INR 1.0, PTT 34.  Sodium 142, potassium 4.4, chloride 109, CO2 27, glucose 162, BUN 23, creatinine 1.1, calcium 9.4, albumin 3.3.  AST 19, ALT 10, alkaline phosphatase 25, total bilirubin 0.3.  EKG showed normal sinus rhythm with nonspecific T wave abnormality.  HOSPITAL COURSE:  On March 30, patient was admitted to the orthopedic floor and she was put in [facility] traction, 5 pounds.  She was started on oral and IV pain medications.  Operation permit signed by her son.  On September 13, 2001, patient was taken to the operating room and a left Gamma nail procedure was performed.  Surgeon was [employer]. Ophelia Charter, M.D., and assistant was Fayrene Fearing ______ , P.A.-C.  Anesthesia was spinal and EBL less than 100 cc.  There were no surgical or anesthesia complications and the patient was transferred back to the orthopedic floor in stable condition.   On September 14, 2001, the patient complained of pain.   Blood pressure 80/40, pulse 88, temperature 100.7. Hemoglobin down to 6.9.  PT 14.4, INR 1.2, and patient neurovscularly intact. Order was given to transfuse one unit of packed red blood cells this a.m. and, then, she will be given another one tomorrow, a.m.  PT evaluated patient so that she would need total assistance x 2 for transfers.  On September 15, 2001, a second unit of packed red blood cells being given.  Vital signs stable, afebrile.  PT 15.3, INR 1.3, hemoglobin 10.4, hematocrit 30.5.  Patient was started on a Coumadin pharmacy protocol on April 1.  Care management spoke with patient and her son in reference to discharge planning and both were agreeable to SNF placement.  On September 16, 2001, the patient had good pain control and vital signs stable, afebrile.  PT 16.2, INR 1.4, hemoglobin 8.5. OT evaluated patient and recommended NHP.  On September 17, 2001, patient complained of increased pain.  There is a note on the chart from nursing which stated that patient continued to have serous drainage from her wound along with left lower extremity swelling.  Vital signs stable, afebrile.  Hemoglobin 8.6, PT 20.9, INR 2.2.  Patients staples are intact and there are no signs of wound infection.  There was some serous drainage from the dressing.  She did have a left calf tenderness with left lower extremity swelling.  CMP was ordered along with a venous Doppler to rule out DVT even though the patient was currently on DVT prophylaxis.  On April 4, bed offers were received from [facility], [facility], and [facility].  Family requested [facility] placement.  On September 18, 2001, patient had good pain control, vital signs stable, afebrile.  Hemoglobin 9.1, PT 20.3, INR 2.1, albumin 2.0, and total protein 4.7.  A venous Doppler was negative for DVT.  Encouraged better p.o.s due to her low albumin which was probably causing the serous drainage from her wound.  Foley was discontinued and order  given for IV Heplock with good p.o.s.  On September 19, 2001, vital signs stable, patient afebrile. Hemoglobin 9.8.  She was doing pretty well.  It was found that she was ready for discharge.  Discharge when bed becomes available tomorrow.  DISPOSITION:  Discharged to a skilled nursing facility.  CONDITION:  Good and stable.  MEDICATIONS:  1. Restoril 15 mg p.o. q.h.s. p.r.n.  2. Senna/docusate sodium two tablets p.o. q.h.s.  3. Colace 100 mg p.o. b.i.d.  4. Lexapro 5 mg p.o. q.h.s.  5. Eldertonic 10 ml p.o. b.i.d.  6. Zyprexa 2.5 mg p.o. q.h.s.  7. Aricept 10 mg q. 8 p.m.  8. FESO 4 325 mg p.o. b.i.d. with meals.  9. Coumadin pharmacy protocol. 10. Vicodin one to two tablets p.o. q.4-6h. p.r.n. for pain. 11. Tylenol 325 to 650 mg p.o. q.4h. p.r.n. temperature greater than 101.5. 12. Imodium 2 mg caplets p.r.n., maximum dose eight caplets per day. 13. Dulcolax 10 mg suppositories p.r.n.  INSTRUCTIONS:  Patient will continue to work with physical therapy to work on bed to wheelchair transfers.  They will also work on lower extremity strengthening. also improve range of motion.  Dressing changes p.r.n.  Staples may be removed two weeks after surgical date.  She will remain on Coumadin therapy three to four weeks after her surgery date.  She will follow up in our office in two weeks for recheck and we will shoot x-rays.  If there are any problems or complications before that time, the SNF will call our office at [phone] to schedule an earlier appointment or discuss any concerns.  I spoke with the patients son this morning in reference to discharge planning. Attending Physician:  Jacki Cones DD:  09/19/01 TD:  09/19/01 Job: [id number]

## 2010-11-01 NOTE — H&P (Signed)
[name], [name]                        ACCOUNT NO.:  [id number]   MEDICAL RECORD NO.:  [id number]                   PATIENT TYPE:  INP   LOCATION:  2866                                 FACILITY:  [facility]   PHYSICIAN:  Mark C. Ophelia Charter, M.D.                 DATE OF BIRTH:  10-07-1915   DATE OF ADMISSION:  04/13/2002  DATE OF DISCHARGE:                                HISTORY & PHYSICAL   CHIEF COMPLAINT:  Left hip pain.   HISTORY OF PRESENT ILLNESS:  The patient is an 77 year old white female who  is status post left Gamma nail for femur fracture on 09/13/01.  She presents  to the office for a preoperative evaluation for a left hip Gamma nail  removal and cemented hemiarthroplasty.  X-rays showed that the patient has  continued to have an intertrochanteric nonunion with increased pain and  difficulty weightbearing.  She has remained in a wheelchair over the past  several months.   MEDICATIONS:  1. Hydrocodone.  2. Lexapro 5 mg p.o. q.h.s.  3. Tramadol HCl 50 mg one-half tablet p.o. b.i.d.  4. Claritin 10 mg p.o. q.d.  5. Restoril 15 mg p.o. q.h.s.  6. Bisacodyl 10 mg suppositories p.r.n.  7. Tylenol 325 mg p.o. q.4h. p.r.n. for high temperature.   ALLERGIES:  ASA.   PAST SURGICAL HISTORY AND HOSPITALIZATIONS:  1. Left breast mastectomy for cancer.  2. History of dementia.  3. History of asthma.  4. History of hypertension.   FAMILY HISTORY:  Noncontributory.   SOCIAL HISTORY:  The patient currently resides in a nursing home.  Denies  smoking or alcohol consumption.   REVIEW OF SYSTEMS:  Positive for history of alcoholism, hypertension, GERD,  asthma, and dementia.  Currently denies any chest pain, shortness of breath,  abdominal pain, nausea, vomiting, fevers, chills, night sweats.  All other  systems are noncontributory.   PHYSICAL EXAMINATION:  VITAL SIGNS:  Temperature 98.6, pulse 84,  respirations 20, blood pressure 120/77.  GENERAL:  Pleasant, elderly white  female who is in no acute distress.  HEENT:  Head is normocephalic, atraumatic.  PERRLA, EOMI.  Oral mucosa pink  and moist.  NECK:  Full range of motion.  Supple, nontender.  Carotids are intact.  No  lymphadenopathy.  CHEST:  She has bilateral wheezes.  No rales or rhonchi.  Good respiratory  expansion.  No distress.  BREASTS:  Not examined.  CARDIAC:  RRR.  S1, S2.  No murmurs, rubs, or gallops noted.  ABDOMEN:  Round, nondistended.  NABS x4.  Soft, nontender.  No masses or  organomegaly.  GENITOURINARY:  Not examined.  EXTREMITIES:  The patient is sitting in a wheelchair and is unable to  weightbear.  The left hip is painful with range of motion.  Sensory and  motor function intact.  Pulses are intact.  Mild left hip trochanter  tenderness.  SKIN:  Warm and  dry.   LABORATORY DATA:  X-rays of the left hip show migration of hip screw to  articular surface of femoral head with intertrochanteric nonunion.   ASSESSMENT:  Left intertrochanteric nonunion, status post a Gamma nail  insertion March 2003.   PLAN:  Left hip Gamma nail removal and cemented hemiarthroplasty.  Operative  risks and nonoperative treatments discussed with the patient.  Possible  risks and complications, such as bleeding, infection, nerve damage,  stiffness, dislocation, fractures, DVT, PE, and reoperation/revision, also  discussed.  All questions answered, and she wishes to proceed with  scheduling of the procedure at this time.       Genene Churn. Denton Meek.                      Mark C. Ophelia Charter, M.D.    JMO/MEDQ  D:  04/13/2002  T:  04/13/2002  Job:  [id number]

## 2010-11-01 NOTE — Discharge Summary (Signed)
NAME:  [name], [name]                        ACCOUNT NO.:  [id number]   MEDICAL RECORD NO.:  [id number]                   PATIENT TYPE:  INP   LOCATION:  5010                                 FACILITY:  [facility]   PHYSICIAN:  Genene Churn. Barry Dienes, P.A.                DATE OF BIRTH:  05-04-1916   DATE OF ADMISSION:  04/13/2002  DATE OF DISCHARGE:  04/18/2002                                 DISCHARGE SUMMARY   DISCHARGE DIAGNOSES:  1. Status post left hip Gamma nail removal and hemiarthroplasty.  2. Urinary tract infection, not otherwise specified.  3. Long-term use of anticoagulants.  4. Hypertension, not otherwise specified.  5. History of organic brain syndrome, not elsewhere classified.   HISTORY OF PRESENT ILLNESS:  This 77 year old white female who is status  post left hip Gamma nail for a femur fracture on September 13, 2001, presented  to our office for a preoperative evaluation for a left hip Gamma nail  removal and cemented hemiarthroplasty.  X-rays showed that the patient  continued to have an intertrochanteric nonunion.  She has had increased pain  and difficulty weightbearing.   ADMISSION LABORATORY DATA:  WBC 6.3, RBC 4.35, hemoglobin 12.3, hematocrit  37.9, platelets 290.  PT 13.1, INR 0.9, PTT 48.  NA 139, K 4.1, Cl 103, CO2  of 28, glucose 110, BUN 22, creatinine 0.9.  Calcium 9.6, albumin 3.4, AST  25, ALT 17, ALP 29, T-bilirubin 0.4.   HOSPITAL COURSE:  On April 13, 2002, the patient was taken to the [facility] Operating Room, and a left hip Gamma nail removal and  cemented arthroplasty procedure was performed.  The surgeon was Dr. Loraine Leriche C.  Ophelia Charter, and assistant Genene Churn. Barry Dienes, P.A.-C.  Anesthesia was general with  local Marcaine at 0.5% postoperatively.  The estimated blood loss was less  than 300 cc.  There were no surgical or anesthesia complications.  The  patient was transferred to the recovery room and then to the orthopedic  floor in stable  condition.  On April 14, 2002, the patient complained of  pain.  The patient was lying in bed with the left lower extremity in slight  internal rotation noted.  An x-ray was ordered to make sure the hip was not  dislocated.  Started FeSO4, 325 mg p.o. b.i.d.  X-ray report called in that  was negative for a dislocation.  The patient was started on pharmacy  protocol Coumadin.  On April 15, 2002, the patient had good pain control.  Her temperature was 101.1 degrees.  Hemoglobin was 7.1.  PT 18.9, INR 1.7.  The patient was transfused 1 unit of packed red blood cells.  Encouraged  incentive spirometry q.2h. while awake.  On April 16, 2002, the patient  was stable.  Hemoglobin 8.0.  A urinary tract infection showed a Proteus  mirabilis.  The patient on IV Ancef.  The  patient was again transfused 1  unit of packed red blood cells.  On April 17, 2002, the patient was stable.  Hemoglobin 10.0.  The wound  looks good.   DISPOSITION:  The patient was ready for a transfer to [facility] on April 18, 2002.  She was doing very well.   CONDITION ON DISCHARGE:  Good and stable.   DISCHARGE MEDICATIONS:  1. Vicodin.  2. Lexapro 5 mg p.o. q.h.s.  3. Claritin 10 mg p.o. q.d.  4. Restoril 15 mg p.o. q.h.s.  5. Bisacodyl 10 mg _________ p.r.n.  6. Coumadin for deep vein thrombosis prophylaxis.   INSTRUCTIONS:  The patient will continue to work with physical therapy to  improve her ambulation and strength.  She will remain on Coumadin for deep  vein thrombosis prophylaxis x3-4 weeks postoperatively.  She will have  dressing changes p.r.n.  The staples to be removed in two weeks  postoperative.   FOLLOW UP:  She will follow up in the office in one to two weeks for a  recheck with repeat x-rays.  If there are any problems or complications  before that time, she will call and schedule an earlier appointment.                                                  Genene Churn. Denton Meek.    JMO/MEDQ  D:  05/23/2002  T:  05/23/2002  Job:  [id number]

## 2010-11-01 NOTE — Discharge Summary (Signed)
NAME:  [name], [name]                        ACCOUNT NO.:  [id number]   MEDICAL RECORD NO.:  [id number]                   PATIENT TYPE:  INP   LOCATION:  5010                                 FACILITY:  [facility]   PHYSICIAN:  Mark C. Ophelia Charter, M.D.                 DATE OF BIRTH:  [birth year]   DATE OF ADMISSION:  04/13/2002  DATE OF DISCHARGE:  04/18/2002                                 DISCHARGE SUMMARY   FINAL DIAGNOSES:  1. Status post left hip gamma nail removal and placement of cemented     hemiarthroplasty.  2. History of dementia.  3. History of asthma.  4. History of hypertension.  5. History of left breast mastectomy for cancer.  6. History of gastroesophageal reflux disease.  7. History of long-term use of anticoagulants.   HISTORY OF PRESENT ILLNESS:  [age]-year-old white female who presented  to the hospital for a left hip gamma nail removal and cement  hemiarthroplasty.  She is status post left gamma nail insertion for femur  fracture, September 13, 2001.  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  She has been in a wheelchair over the past several months.   HOSPITAL COURSE:  On April 13, 2002, the patient was taken to the  operating room at [facility] and a left hip gamma nail removal and cemented  hemiarthroplasty procedure were performed, surgeon -- Loraine Leriche C. Ophelia Charter, M.D.,  and assistant -- Genene Churn. Denton Meek.  Anesthesia was general with local  Marcaine 0.5% postop.  EBL:  Less than 300 cc.  There were no surgical  complications and the patient was transferred to recovery and the orthopedic  floor in stable condition.  On April 14, 2002, the patient complained of  increased hip pain.  Temperature 98.1, blood pressure 84/47, pulse 103,  respirations 16.  PT 14.5, INR 1.1, hemoglobin 8.2 and preop hemoglobin  12.3.  On exam, the patient was sitting with her left lower extremity in  slight internal rotation.  There was a question of some shortening on the  left leg, shortening possibly secondary to the patient's position in bed.  With log-rolling, she had increased discomfort.  Left hip portable x-ray was  ordered to check for dislocation.  Morphine sulfate 325 mg p.o. b.i.d. was  started.  At 11:45 a.m., I received a call at the office in reference to the  patient's x-ray report which showed that there was no dislocation and  prosthesis was in good position.  The patient was started on pharmacy  protocol Coumadin.  Care management evaluated the patient.  April 15, 2002, the patient had good pain control and no specific complaints;  temperature 101.1, blood pressure 115/42, pulse 105, respirations 16; PT  18.9, INR 1.7, hemoglobin 7.1.  The patient was transfused one unit of  packed red blood cells slowly  and order was given for one more unit on  April 16, 2002, incentive spirometry q.2h. while awake encouraged, IV rate  decreased to 16 cc/hr.  PT evaluated the patient.  April 16, 2002, the  patient was stable, vital signs stable and afebrile; PT 19.6, INR 1.8;  hemoglobin 8.0 after one unit of packed red blood cells; urine C&S positive  for Proteus mirabilis; patient already on Ancef.  The patient was transfused  one more unit of packed red blood cells.  Plan on discharge back to  [facility].  April 17, 2002, the patient had good pain control, vital  signs stable and afebrile, PT 19.5, INR 1.8, hemoglobin 10.0.  Wound looks  good without signs of infection.  Staples are intact.  The patient is ready  for discharge to [facility] on April 18, 2002.  Hemoglobin much improved after second unit of packed red blood cells.   DISPOSITION:  Discharged to [facility], April 18, 2002.   DISCHARGE MEDICATIONS:  1. Colace 100 mg p.o. b.i.d.  2. Claritin 10 mg p.o. every day.  3. Lexapro 5 mg p.o. q.h.s.  4. Morphine sulfate 325 mg  p.o. b.i.d. with meals.  5. Coumadin pharmacy protocol.  6. Laxative of choice.  7. Enema of choice.  8. Vicodin one to two tabs p.o. q.4-6h. p.r.n. for pain.  9. Tylenol 325 mg to 650 mg p.o. q.4h. p.r.n. for temperature greater than     101.5.  10.      Restoril 15 mg p.o. q.h.s. p.r.n. for sleep.  11.      Bisacodyl suppositories 10 mg p.r.n.   DISCHARGE INSTRUCTIONS:  Upon the patient's return to [facility], she will  be started with physical therapy to improve ambulation and strengthening;  she will do this three to five times per week over the next several weeks.  Staples to be removed two weeks postop.  She will remain on Coumadin for DVT  prophylaxis three to four weeks postop.  Knee immobilizer is to be on the  left lower extremity at night only to prevent her from flexing her hip and  possibly causing dislocation.  She will be weightbearing as tolerated.   FOLLOWUP:  Office followup two weeks from the date of her discharge from  [facility] with repeat x-rays.  If there are any problems or complications  before that time, [facility] nursing staff will call and schedule an earlier  appointment.     Genene Churn. Denton Meek.                      Mark C. Ophelia Charter, M.D.    JMO/MEDQ  D:  04/17/2002  T:  04/17/2002  Job:  [id number]

## 2010-11-01 NOTE — Op Note (Signed)
[name], [name]                        ACCOUNT NO.:  [id number]   MEDICAL RECORD NO.:  [id number]                   PATIENT TYPE:  INP   LOCATION:  2866                                 FACILITY:  [facility]   PHYSICIAN:  Mark C. Ophelia Charter, M.D.                 DATE OF BIRTH:  [birth year]   DATE OF PROCEDURE:  04/13/2002  DATE OF DISCHARGE:                                 OPERATIVE REPORT   PREOPERATIVE DIAGNOSIS:  Intertrochanteric, subtrochanteric nonunion with  Gamma nail lag screw cut-out.   PROCEDURES:  1. Left hip removal of Gamma intertrochanteric nail.  2. Placement of cemented bipolar hemiarthroplasty.  Osteonics #6 ODC     cemented stem, +10 neck, 45 mm monopolar ball.   SURGEON:  Mark C. Ophelia Charter, M.D.   ANESTHESIA:  Genene Churn. Denton Meek.   ANESTHESIA:  GOT.   ESTIMATED BLOOD LOSS:  400 ml.   DESCRIPTION OF PROCEDURE:  After induction of general anesthesia,  preoperative Ancef prophylaxis, patient placed in the lateral position with  standard prepping and draping using Duraprep, usual hip sheets, drapes,  impervious stockinette, Coban, and Betadine Vi-Drape after sterile skin  marker x2 to seal the hip and groin.  A posterior approach was made,  incorporating the old small stab incisions for lag screw and distal  interlock screws.  A posterior approach was made, splitting the gluteus  maximus.  The tensor fascia was split in line with the fibers.  The old  screw was palpated, a small stab was made in the vastus lateralis fascia,  and the distal interlock screw was removed by hand.  The tip of the  trochanter was identified and the set screw was backed out enough so that  the lag screw could be removed.  Next the Gamma nail itself was grasped and  removed.  The cookie cutter was used initially, followed by canal starter  reamer, and care was taken not to violate the canal.  The lag screw hole and  also the distal interlock hole were checked to make sure that the  reamers  were placed directly intramedullary centered within the canal.  Sequential  reaming up to 6 was performed, followed by sequential broaching, first with  a 4, then a 5.  A 6 was excessively tight.  It was decided to [place] the #6 stem  and use a thinner cement mantle due to the deformity in the  intertrochanteric region.  There was a calcar.  It was elected not to  perform a calcar replacement.  Neck had been cut.  Looking at the head, the  screw had cut out of the head and there was some fibrous tissue present.  The screw was not rubbing directly up against the acetabular cartilage, but  soft tissue was covering the threads of the screw and it was 0.5 mm below  the articular surface of the head.  The rest of the  head was normal and was  measured at 44 mm.  A 43, 44, 45 were tried.  The 44 gave a good suction fit  and was selected.  Some soft tissue was removed from the fovea.  There was  an excellent suction fit.  Trial neck lengths were tried, +10 improved the  shortening that the patient had and was the maximal length for the  monopolar.  This gave flexion to 90 degrees, adduction, and internal  rotation to 80 degrees.  The leg was fully extended.  The sciatic nerve was  not tight.  With the leg in neutral, there was no shuck, the knee would flex  past 90 degrees.  There was good stability in full extension, external  rotation, with no anterior instability.  The permanent prosthesis was then  opened.  The canal was prepared with the bottle brush.  Sponges were placed  in the glove, and the distal interlock hole was held with the gloved finger  by the assistant.  Cement was vacuum-mixed, inserted with a pressure gun,  with care taken to cover the hole so that no cement would extrude after a #4  cement restrictor was placed at the appropriate depth using the permanent  prosthesis with a 13 mm distal centralizer.  The permanent prosthesis was  impacted into place, excellent fit.  Cement  hardened at 12 minutes.  Hip was  reduced.  Identical findings of stability and tensor fascia, gluteus maximus  were repaired.  Gluteus maximus was repaired with 0 Vicryl, 0 and #1  Ethibond in the tensor fascia, figure-of-eight sutures for tight closure, 2-  0 Vicryl in the subcutaneous tissue, Marcaine infiltration of the skin, skin  staple closure, and postop dressing and knee immobilizer.  Instrument count  and needle count was correct.  The patient tolerated the procedure well, was  transferred to the recovery room in stable condition.                                                  Mark C. Ophelia Charter, M.D.    MCY/MEDQ  D:  04/13/2002  T:  04/13/2002  Job:  [id number]

## 2010-11-01 NOTE — Op Note (Signed)
[place]  Patient:    [name], [name] Visit Number: [id number] MRN: [id number]          Service Type: SUR Location: [id number] Attending Physician:  Jacki Cones Dictated by:   Veverly Fells Ophelia Charter, M.D. Proc. Date: 09/13/01 Admit Date:  09/12/2001                             Operative Report  PREOPERATIVE DIAGNOSIS:  Left intertrochanteric subtrochanteric hip fracture.  POSTOPERATIVE DIAGNOSIS:  Left intertrochanteric subtrochanteric hip fracture.  PROCEDURE:  Left hip gamma nail.  SURGEON:  Mark C. Ophelia Charter, M.D.  ASSISTANT:  _____  ANESTHESIA:  Spinal.  ESTIMATED BLOOD LOSS:  200 ml.  DESCRIPTION OF PROCEDURE:  After induction of spinal anesthesia, the patient was placed on the fracture table with well leg in the flexed position holder. The left lower extremity was placed in traction with internal rotation to lateralize the trochanter and reduction was checked under fluoroscopy.  The left arm was carefully padded and placed across the chest.  Preoperative Ancef was given prophylactically.  Hip was prepped with DuraPrep and the usual sterile towels and large Betadine Vi-Drape was applied. Incision was made proximal to the trochanter.  Subcutaneous tissue sharply dissected with the Bovie electrocautery.  Gluteus medius fascia was split and awl was used to start the entry point for the nail in the trochanter.  _____ wire was placed down the intramedullary canal, checked under fluoroscopy AP and lateral.  Sequential reaming distally up to 13 and proximal to 17 and the gamma nail was then inserted.  A 135 gamma nail was used.  A 90 mm lag screw was inserted.  Distal interlocking screws, 25 mm, were then placed.  After irrigation, 2-0 was used in the proximal incision reapproximating the fascia and subcutaneous tissue.  Skin staple closure.  Postop dressing.  Hip was taken out of traction and rotated just prior to skin closure making  sure that the compression screw was well contained within the head.  There was good stability to the fracture.  The patient the transferred to the recovery room in stable condition.  Dictated by:   Veverly Fells Ophelia Charter, M.D. Attending Physician:  Jacki Cones DD:  09/13/01 TD:  09/13/01 Job: [phone]

## 2011-04-01 LAB — CBC
HCT: 35.8 — ABNORMAL LOW
MCHC: 32.7
MCHC: 33.1
MCHC: 33.2
MCV: 90.7
MCV: 91.1
Platelets: 186
RBC: 3.63 — ABNORMAL LOW
RBC: 3.68 — ABNORMAL LOW
RBC: 3.95
RDW: 14.4 — ABNORMAL HIGH
RDW: 14.9 — ABNORMAL HIGH
WBC: 13.6 — ABNORMAL HIGH

## 2011-04-01 LAB — BASIC METABOLIC PANEL
BUN: 16
BUN: 29 — ABNORMAL HIGH
CO2: 24
CO2: 28
CO2: 28
Calcium: 7.7 — ABNORMAL LOW
Calcium: 8 — ABNORMAL LOW
Chloride: 100
Chloride: 101
Chloride: 104
Creatinine, Ser: 0.87
Creatinine, Ser: 0.93
Creatinine, Ser: 1.35 — ABNORMAL HIGH
GFR calc Af Amer: 50 — ABNORMAL LOW
GFR calc Af Amer: >60
Glucose, Bld: 104 — ABNORMAL HIGH
Glucose, Bld: 272 — ABNORMAL HIGH
Potassium: 3.9

## 2011-04-01 LAB — URINALYSIS, ROUTINE W REFLEX MICROSCOPIC
Glucose, UA: NEGATIVE
Ketones, ur: NEGATIVE
Protein, ur: NEGATIVE
Urobilinogen, UA: 0.2

## 2011-04-01 LAB — LIPID PANEL
Cholesterol: 140
LDL Cholesterol: 75

## 2011-04-02 LAB — CULTURE, BLOOD (ROUTINE X 2)
Culture: NO GROWTH
Culture: NO GROWTH

## 2011-04-02 LAB — COMPREHENSIVE METABOLIC PANEL
Albumin: 4
BUN: 22
Creatinine, Ser: 1.06
Glucose, Bld: 224 — ABNORMAL HIGH
Total Bilirubin: 0.9
Total Protein: 8.1

## 2011-04-02 LAB — DIFFERENTIAL
Basophils Absolute: 0
Basophils Relative: 0
Eosinophils Absolute: 0
Eosinophils Relative: 0
Lymphocytes Relative: 6 — ABNORMAL LOW
Lymphs Abs: 0.9
Monocytes Absolute: 1 — ABNORMAL HIGH
Monocytes Relative: 7
Neutro Abs: 12.4 — ABNORMAL HIGH
Neutrophils Relative %: 87 — ABNORMAL HIGH

## 2011-04-02 LAB — COMPREHENSIVE METABOLIC PANEL WITH GFR
ALT: 20
AST: 44 — ABNORMAL HIGH
Alkaline Phosphatase: 32 — ABNORMAL LOW
CO2: 26
Calcium: 10.4
Chloride: 99
GFR calc Af Amer: 59 — ABNORMAL LOW
GFR calc non Af Amer: 49 — ABNORMAL LOW
Potassium: 3.4 — ABNORMAL LOW
Sodium: 139

## 2011-04-02 LAB — URINE CULTURE
Colony Count: NO GROWTH
Culture: NO GROWTH

## 2011-04-02 LAB — TROPONIN I
Troponin I: 0.1 — ABNORMAL HIGH
Troponin I: 0.14 — ABNORMAL HIGH

## 2011-04-02 LAB — URINE MICROSCOPIC-ADD ON

## 2011-04-02 LAB — CK TOTAL AND CKMB (NOT AT ARMC)
CK, MB: 16.3 — ABNORMAL HIGH
CK, MB: 37 — ABNORMAL HIGH
Relative Index: 2.5
Relative Index: 4.6 — ABNORMAL HIGH
Total CK: 657 — ABNORMAL HIGH
Total CK: 799 — ABNORMAL HIGH

## 2011-04-02 LAB — CBC
HCT: 48.7 — ABNORMAL HIGH
Hemoglobin: 16.1 — ABNORMAL HIGH
MCHC: 33
MCV: 91.5
Platelets: 250
RBC: 5.32 — ABNORMAL HIGH
RDW: 14.8 — ABNORMAL HIGH
WBC: 14.4 — ABNORMAL HIGH

## 2011-04-02 LAB — POCT CARDIAC MARKERS
CKMB, poc: 37.8
Myoglobin, poc: 423
Operator id: 284141
Troponin i, poc: <0.05

## 2011-04-02 LAB — URINALYSIS, ROUTINE W REFLEX MICROSCOPIC
Bilirubin Urine: NEGATIVE
Glucose, UA: 500 — AB
Ketones, ur: 15 — AB
Leukocytes, UA: NEGATIVE
Nitrite: NEGATIVE
Protein, ur: >300 — AB
Specific Gravity, Urine: 1.02
Urobilinogen, UA: 0.2
pH: 7.5

## 2011-04-02 LAB — CARDIAC PANEL(CRET KIN+CKTOT+MB+TROPI)
CK, MB: 6.1 — ABNORMAL HIGH
Relative Index: 1.6

## 2011-04-02 LAB — B-NATRIURETIC PEPTIDE (CONVERTED LAB): Pro B Natriuretic peptide (BNP): 248 — ABNORMAL HIGH

## 2011-05-12 ENCOUNTER — Encounter: Payer: Self-pay | Admitting: Cardiology

## 2011-05-12 NOTE — Progress Notes (Signed)
This encounter was created in error - please disregard.

## 2012-09-14 ENCOUNTER — Non-Acute Institutional Stay (SKILLED_NURSING_FACILITY): Payer: PRIVATE HEALTH INSURANCE | Admitting: Internal Medicine

## 2012-09-14 DIAGNOSIS — I10 Essential (primary) hypertension: Secondary | ICD-10-CM

## 2012-09-14 DIAGNOSIS — G309 Alzheimer's disease, unspecified: Secondary | ICD-10-CM

## 2012-09-14 DIAGNOSIS — K219 Gastro-esophageal reflux disease without esophagitis: Secondary | ICD-10-CM

## 2012-09-14 DIAGNOSIS — K5900 Constipation, unspecified: Secondary | ICD-10-CM

## 2012-09-15 NOTE — Progress Notes (Signed)
PROGRESS NOTE  DATE: 09/14/12  FACILITY: [facility]  LEVEL OF CARE: SNF  Routine Visit  CHIEF COMPLAINT:  Manage hypertension and dementia  HISTORY OF PRESENT ILLNESS:  REASSESSMENT OF ONGOING PROBLEM(S):  1. DEMENTIA: The dementia remaines stable and continues to function adequately in the current living environment with supervision.  The patient has had little changes in behavior. No complications noted from the medications presently being used. Patient does not follow commands. She is on hospice.  2. HTN: Pt 's HTN remains stable. Staff  Denies CP, sob, DOE, pedal edema, headaches, dizziness or visual disturbances.  No complications from the medications currently being used.  Last BP : 126/64, 104/59, 131/76.  PAST MEDICAL HISTORY : Reviewed.  No changes.  CURRENT MEDICATIONS: Reviewed per [facility]  REVIEW OF SYSTEMS: Unobtainable due to dementia  PHYSICAL EXAMINATION  VS:  T 97      P 62     RR 18      BP     POX %     WT (Lb)  GENERAL: no acute distress, thin body habitus NECK: supple, trachea midline, no neck masses, no thyroid tenderness, no thyromegaly RESPIRATORY: breathing is even & unlabored, BS CTAB CARDIAC: RRR, no murmur,no extra heart sounds, no edema GI: abdomen soft, normal BS, no masses, no tenderness, no hepatomegaly, no splenomegaly PSYCHIATRIC: the patient is alert & disoriented, affect & behavior appropriate  LABS/RADIOLOGY:  1/14 BUN 42, creatinine 1.55 otherwise BMP normal 11/13 total protein 5.8 otherwise CMP normal  ASSESSMENT/PLAN:  1. hypertension-well-controlled. 2. Alzheimer dementia-advanced. 3. GERD-stable. 4. constipation-adequately controlled. 5. hip pain-patient is comfortable. 6. anxiety-stable.  CPT CODE: 40981

## 2012-10-11 ENCOUNTER — Other Ambulatory Visit: Payer: Self-pay | Admitting: *Deleted

## 2012-10-11 MED ORDER — MORPHINE SULFATE ER 30 MG PO TBCR: EXTENDED_RELEASE_TABLET | ORAL | Status: DC

## 2012-10-11 MED ORDER — MORPHINE SULFATE ER 30 MG PO CP24: ORAL_CAPSULE | ORAL | Status: DC

## 2012-11-03 ENCOUNTER — Non-Acute Institutional Stay (SKILLED_NURSING_FACILITY): Payer: PRIVATE HEALTH INSURANCE | Admitting: Internal Medicine

## 2012-11-03 DIAGNOSIS — K219 Gastro-esophageal reflux disease without esophagitis: Secondary | ICD-10-CM

## 2012-11-03 DIAGNOSIS — I10 Essential (primary) hypertension: Secondary | ICD-10-CM

## 2012-11-03 DIAGNOSIS — K5900 Constipation, unspecified: Secondary | ICD-10-CM

## 2012-11-03 DIAGNOSIS — F0280 Dementia in other diseases classified elsewhere without behavioral disturbance: Secondary | ICD-10-CM

## 2012-11-03 DIAGNOSIS — G309 Alzheimer's disease, unspecified: Secondary | ICD-10-CM

## 2012-11-06 DIAGNOSIS — G309 Alzheimer's disease, unspecified: Secondary | ICD-10-CM | POA: Insufficient documentation

## 2012-11-06 DIAGNOSIS — K5900 Constipation, unspecified: Secondary | ICD-10-CM | POA: Insufficient documentation

## 2012-11-06 DIAGNOSIS — K219 Gastro-esophageal reflux disease without esophagitis: Secondary | ICD-10-CM | POA: Insufficient documentation

## 2012-11-06 DIAGNOSIS — I10 Essential (primary) hypertension: Secondary | ICD-10-CM | POA: Insufficient documentation

## 2012-11-06 NOTE — Progress Notes (Signed)
PROGRESS NOTE  DATE: 11/03/12  FACILITY: [facility]  LEVEL OF CARE: SNF  Routine Visit  CHIEF COMPLAINT:  Manage hypertension and dementia  HISTORY OF PRESENT ILLNESS:  REASSESSMENT OF ONGOING PROBLEM(S):  1. DEMENTIA: The dementia remaines stable and continues to function adequately in the current living environment with supervision.  The patient has had little changes in behavior. No complications noted from the medications presently being used. Patient does not follow commands. She is on hospice.  2. HTN: Pt 's HTN remains stable. Staff  Denies CP, sob, DOE, pedal edema, headaches, dizziness or visual disturbances.  No complications from the medications currently being used.  Last BP : 100/46, 126/64, 104/59, 131/76.  PAST MEDICAL HISTORY : Reviewed.  No changes.  CURRENT MEDICATIONS: Reviewed per [facility]  REVIEW OF SYSTEMS: Unobtainable due to dementia  PHYSICAL EXAMINATION  VS:  T 96.3     P 86    RR 20      BP     POX %     WT (Lb)  GENERAL: no acute distress, thin body habitus NECK: supple, trachea midline, no neck masses, no thyroid tenderness, no thyromegaly RESPIRATORY: breathing is even & unlabored, BS CTAB CARDIAC: RRR, no murmur,no extra heart sounds, no edema GI: abdomen soft, normal BS, no masses, no tenderness, no hepatomegaly, no splenomegaly PSYCHIATRIC: the patient is alert & disoriented, affect & behavior appropriate  LABS/RADIOLOGY:  1/14 BUN 42, creatinine 1.55 otherwise BMP normal 11/13 total protein 5.8 otherwise CMP normal, Hb 9.9, mcv 90 ow cbc nl  ASSESSMENT/PLAN:  1. hypertension-well-controlled. 2. Alzheimer dementia-advanced. 3. GERD-stable. 4. constipation-adequately controlled. 5. hip pain-patient is comfortable. 6. anxiety-stable.  CPT CODE: 16109

## 2012-11-29 ENCOUNTER — Other Ambulatory Visit: Payer: Self-pay | Admitting: Geriatric Medicine

## 2012-12-28 ENCOUNTER — Other Ambulatory Visit: Payer: Self-pay | Admitting: Geriatric Medicine

## 2012-12-29 ENCOUNTER — Other Ambulatory Visit: Payer: Self-pay | Admitting: Geriatric Medicine

## 2012-12-29 MED ORDER — MORPHINE SULFATE ER 30 MG PO TBCR: EXTENDED_RELEASE_TABLET | ORAL | Status: DC

## 2012-12-30 ENCOUNTER — Encounter: Payer: Self-pay | Admitting: Adult Health

## 2012-12-30 ENCOUNTER — Other Ambulatory Visit: Payer: Self-pay | Admitting: Geriatric Medicine

## 2012-12-30 ENCOUNTER — Non-Acute Institutional Stay (SKILLED_NURSING_FACILITY): Payer: PRIVATE HEALTH INSURANCE | Admitting: Adult Health

## 2012-12-30 DIAGNOSIS — K219 Gastro-esophageal reflux disease without esophagitis: Secondary | ICD-10-CM

## 2012-12-30 DIAGNOSIS — F0280 Dementia in other diseases classified elsewhere without behavioral disturbance: Secondary | ICD-10-CM

## 2012-12-30 DIAGNOSIS — I10 Essential (primary) hypertension: Secondary | ICD-10-CM

## 2012-12-30 DIAGNOSIS — F411 Generalized anxiety disorder: Secondary | ICD-10-CM

## 2012-12-30 DIAGNOSIS — G8929 Other chronic pain: Secondary | ICD-10-CM

## 2012-12-30 DIAGNOSIS — F419 Anxiety disorder, unspecified: Secondary | ICD-10-CM

## 2012-12-30 DIAGNOSIS — K5900 Constipation, unspecified: Secondary | ICD-10-CM

## 2012-12-30 DIAGNOSIS — F329 Major depressive disorder, single episode, unspecified: Secondary | ICD-10-CM | POA: Insufficient documentation

## 2012-12-30 NOTE — Progress Notes (Signed)
Patient ID: [name], female   DOB: 01-13-1916, 79 y.o.   MRN: [id number]       PROGRESS NOTE  DATE: 12/30/12  FACILITY: [facility]  LEVEL OF CARE: SNF  Routine Visit  CHIEF COMPLAINT:  Manage hypertension and dementia  HISTORY OF PRESENT ILLNESS:  REASSESSMENT OF ONGOING PROBLEM(S):  1. ALZHEIMER'S DISEASE : The dementia remaines stable and continues to function adequately in the current living environment with supervision.  The patient has had little changes in behavior. No complications noted from the medications presently being used. Patient does not follow commands. She is on hospice.  2. HTN: Pt 's HTN remains stable. Staff  Denies CP, sob, DOE, pedal edema, headaches, dizziness or visual disturbances.  No  medications currently being used.  Last BP : 122/71   PAST MEDICAL HISTORY : Reviewed.  No changes.  CURRENT MEDICATIONS: Reviewed per [facility]  REVIEW OF SYSTEMS: Unobtainable due to dementia  PHYSICAL EXAMINATION  VS:  T 97.6     P 78    RR 18      BP 122/71         WT  87 (Lb)  GENERAL: no acute distress, thin body habitus NECK: supple, trachea midline, no neck masses, no thyroid tenderness, no thyromegaly RESPIRATORY: breathing is even & unlabored, BS CTAB CARDIAC: RRR, no murmur,no extra heart sounds, no edema GI: abdomen soft, normal BS, no masses, no tenderness, no hepatomegaly, no splenomegaly PSYCHIATRIC: the patient is alert & disoriented, affect & behavior appropriate  LABS/RADIOLOGY: 11/11/12 sodium 140 potassium 3.6 glucose 105 BUN 37 creatinine 1.17 calcium 9.4 1/14 BUN 42, creatinine 1.55 otherwise BMP normal 11/13 total protein 5.8 otherwise CMP normal, Hb 9.9, mcv 90 ow cbc nl  ASSESSMENT/PLAN:  1. hypertension-well-controlled. 2. Alzheimer dementia-advanced. 3. GERD-stable. 4. constipation-adequately controlled. 5 Chronic pain-patient is comfortable. 6. anxiety-stable. 7. Depression - stable   CPT CODE: 40981

## 2013-01-24 ENCOUNTER — Other Ambulatory Visit: Payer: Self-pay | Admitting: Geriatric Medicine

## 2013-01-24 MED ORDER — MORPHINE SULFATE ER 30 MG PO TBCR: EXTENDED_RELEASE_TABLET | ORAL | Status: DC

## 2013-02-02 ENCOUNTER — Non-Acute Institutional Stay (SKILLED_NURSING_FACILITY): Payer: PRIVATE HEALTH INSURANCE | Admitting: Internal Medicine

## 2013-02-02 DIAGNOSIS — F0280 Dementia in other diseases classified elsewhere without behavioral disturbance: Secondary | ICD-10-CM

## 2013-02-02 DIAGNOSIS — I10 Essential (primary) hypertension: Secondary | ICD-10-CM

## 2013-02-02 DIAGNOSIS — K219 Gastro-esophageal reflux disease without esophagitis: Secondary | ICD-10-CM

## 2013-02-02 DIAGNOSIS — K5900 Constipation, unspecified: Secondary | ICD-10-CM

## 2013-02-04 NOTE — Progress Notes (Signed)
PROGRESS NOTE  DATE: 02/02/13  FACILITY: [facility]  LEVEL OF CARE: SNF  Routine Visit  CHIEF COMPLAINT:  Manage hypertension and dementia  HISTORY OF PRESENT ILLNESS:  REASSESSMENT OF ONGOING PROBLEM(S):  DEMENTIA: The dementia remaines stable and continues to function adequately in the current living environment with supervision.  The patient has had little changes in behavior. No complications noted from the medications presently being used. Patient does not follow commands. She is on hospice.  HTN: Pt 's HTN remains stable. Staff  Denies CP, sob, DOE, pedal edema, headaches, dizziness or visual disturbances.  No complications from the medications currently being used.  Last BP : 100/46, 126/64, 104/59, 131/76, 110/68.  PAST MEDICAL HISTORY : Reviewed.  No changes.  CURRENT MEDICATIONS: Reviewed per [facility]  REVIEW OF SYSTEMS: Unobtainable due to dementia  PHYSICAL EXAMINATION  VS:  T 96.4     P 54    RR 18      BP     POX %     WT (Lb)  GENERAL: no acute distress, thin body habitus NECK: supple, trachea midline, no neck masses, no thyroid tenderness, no thyromegaly RESPIRATORY: breathing is even & unlabored, BS CTAB CARDIAC: RRR, no murmur,no extra heart sounds, no edema GI: abdomen soft, normal BS, no masses, no tenderness, no hepatomegaly, no splenomegaly PSYCHIATRIC: the patient is alert & disoriented, affect & behavior appropriate  LABS/RADIOLOGY:  5/14 glucose 105, BUN 37, creatinine 1.17 otherwise BMP normal, CBC normal  1/14 BUN 42, creatinine 1.55 otherwise BMP normal 11/13 total protein 5.8 otherwise CMP normal, Hb 9.9, mcv 90 ow cbc nl  ASSESSMENT/PLAN:  hypertension-well-controlled. Alzheimer dementia-advanced. GERD-stable. constipation-adequately controlled. hip pain-patient is comfortable. anxiety-stable.  CPT CODE: 11914

## 2013-02-24 ENCOUNTER — Non-Acute Institutional Stay (SKILLED_NURSING_FACILITY): Payer: PRIVATE HEALTH INSURANCE | Admitting: Internal Medicine

## 2013-02-24 DIAGNOSIS — I10 Essential (primary) hypertension: Secondary | ICD-10-CM

## 2013-02-24 DIAGNOSIS — K219 Gastro-esophageal reflux disease without esophagitis: Secondary | ICD-10-CM

## 2013-02-24 DIAGNOSIS — F0280 Dementia in other diseases classified elsewhere without behavioral disturbance: Secondary | ICD-10-CM

## 2013-02-24 DIAGNOSIS — K5900 Constipation, unspecified: Secondary | ICD-10-CM

## 2013-02-24 NOTE — Progress Notes (Signed)
PROGRESS NOTE  DATE: 02/24/13  FACILITY: [facility]  LEVEL OF CARE: SNF  Routine Visit  CHIEF COMPLAINT:  Manage hypertension and dementia  HISTORY OF PRESENT ILLNESS:  REASSESSMENT OF ONGOING PROBLEM(S):  DEMENTIA: The dementia remaines stable and continues to function adequately in the current living environment with supervision.  The patient has had little changes in behavior. No complications noted from the medications presently being used. Patient does not follow commands. She is on hospice.  HTN: Pt 's HTN remains stable. Staff  Denies CP, sob, DOE, pedal edema, headaches, dizziness or visual disturbances.  No complications from the medications currently being used.  Last BP : 100/46, 126/64, 104/59, 131/76, 110/68, 77/50.  PAST MEDICAL HISTORY : Reviewed.  No changes.  CURRENT MEDICATIONS: Reviewed per [facility]  REVIEW OF SYSTEMS: Unobtainable due to dementia  PHYSICAL EXAMINATION  VS:  T 97.7     P 72    RR 20      BP     POX %     WT (Lb)  GENERAL: no acute distress, thin body habitus NECK: supple, trachea midline, no neck masses, no thyroid tenderness, no thyromegaly RESPIRATORY: breathing is even & unlabored, BS CTAB CARDIAC: RRR, no murmur,no extra heart sounds, no edema GI: abdomen soft, normal BS, no masses, no tenderness, no hepatomegaly, no splenomegaly PSYCHIATRIC: the patient is alert & disoriented, affect & behavior appropriate  LABS/RADIOLOGY:  8-14 BUN 32, creatinine 1.4 otherwise BMP normal  5/14 glucose 105, BUN 37, creatinine 1.17 otherwise BMP normal, CBC normal  1/14 BUN 42, creatinine 1.55 otherwise BMP normal 11/13 total protein 5.8 otherwise CMP normal, Hb 9.9, mcv 90 ow cbc nl  ASSESSMENT/PLAN:  hypertension-blood pressure low off of medications. Alzheimer dementia-advanced. GERD-stable. constipation-adequately controlled. hip pain-patient is comfortable. anxiety-stable.  CPT CODE: 16109

## 2013-03-07 ENCOUNTER — Other Ambulatory Visit: Payer: Self-pay | Admitting: *Deleted

## 2013-03-07 MED ORDER — MORPHINE SULFATE ER 30 MG PO TBCR: EXTENDED_RELEASE_TABLET | ORAL | Status: DC

## 2013-03-21 ENCOUNTER — Non-Acute Institutional Stay (SKILLED_NURSING_FACILITY): Payer: PRIVATE HEALTH INSURANCE | Admitting: Adult Health

## 2013-03-21 DIAGNOSIS — F411 Generalized anxiety disorder: Secondary | ICD-10-CM

## 2013-03-21 DIAGNOSIS — K5900 Constipation, unspecified: Secondary | ICD-10-CM

## 2013-03-21 DIAGNOSIS — E876 Hypokalemia: Secondary | ICD-10-CM

## 2013-03-21 DIAGNOSIS — G8929 Other chronic pain: Secondary | ICD-10-CM

## 2013-03-21 DIAGNOSIS — F419 Anxiety disorder, unspecified: Secondary | ICD-10-CM

## 2013-03-21 DIAGNOSIS — F0280 Dementia in other diseases classified elsewhere without behavioral disturbance: Secondary | ICD-10-CM

## 2013-03-21 DIAGNOSIS — K219 Gastro-esophageal reflux disease without esophagitis: Secondary | ICD-10-CM

## 2013-03-21 DIAGNOSIS — I10 Essential (primary) hypertension: Secondary | ICD-10-CM

## 2013-03-21 DIAGNOSIS — F329 Major depressive disorder, single episode, unspecified: Secondary | ICD-10-CM

## 2013-04-08 DIAGNOSIS — E876 Hypokalemia: Secondary | ICD-10-CM | POA: Insufficient documentation

## 2013-04-08 NOTE — Progress Notes (Signed)
Patient ID: [name], female   DOB: 1916-05-13, 79 y.o.   MRN: [id number]       PROGRESS NOTE  DATE: 03/21/13  FACILITY: [facility]  LEVEL OF CARE: SNF (31)  Routine Visit  CHIEF COMPLAINT:  Manage hypertension, hypokalemia and dementia  HISTORY OF PRESENT ILLNESS: This is a 79 year old female who refused to eat nor take her medicines. She was noted to be sleepy. K = 3.0  - low.  REASSESSMENT OF ONGOING PROBLEM(S):  HTN: Pt 's HTN remains stable. Staff  Denies CP, sob, DOE, pedal edema, headaches, dizziness or visual disturbances.  No complications from the medications currently being used.  Last BP :  136/90  CHRONIC PAIN: The patient's chronic pain remains stable.  No complications reported from the medications presently being used.  Patient denies ongoing pain.  GERD: pt's GERD is stable.  Denies ongoing heartburn, abd. Pain, nausea or vomiting.  Currently on a PPI & tolerates it without any adverse reactions.  PAST MEDICAL HISTORY : Reviewed.  No changes.  CURRENT MEDICATIONS: Reviewed per [facility]  REVIEW OF SYSTEMS: Unobtainable due to dementia  PHYSICAL EXAMINATION  VS:  T 97.1     P 68    RR 22     BP136/90      POX94 %     WT76.2 (Lb)  GENERAL: no acute distress, thin body habitus NECK: supple, trachea midline, no neck masses, no thyroid tenderness, no thyromegaly RESPIRATORY: breathing is even & unlabored, BS CTAB CARDIAC: RRR, no murmur,no extra heart sounds, no edema GI: abdomen soft, normal BS, no masses, no tenderness, no hepatomegaly, no splenomegaly PSYCHIATRIC: the patient is alert & disoriented, affect & behavior appropriate  LABS/RADIOLOGY: 8-14 BUN 32, creatinine 1.4 otherwise BMP normal 5/14 glucose 105, BUN 37, creatinine 1.17 otherwise BMP normal, CBC normal 1/14 BUN 42, creatinine 1.55 otherwise BMP normal 11/13 total protein 5.8 otherwise CMP normal, Hb 9.9, mcv 90 ow cbc nl  ASSESSMENT/PLAN:  hypertension- off medications,  well-controlled Alzheimer dementia-advanced. GERD-stable. constipation-adequately controlled. Chronic Pain  - patient is comfortable. anxiety-stable. Hypokalemia - start KCL 20 meq/15 ml PO Q D x 5 days; BMP in 1 week   CPT CODE: 40981

## 2013-05-12 ENCOUNTER — Non-Acute Institutional Stay (SKILLED_NURSING_FACILITY): Payer: PRIVATE HEALTH INSURANCE | Admitting: Adult Health

## 2013-05-12 DIAGNOSIS — F0280 Dementia in other diseases classified elsewhere without behavioral disturbance: Secondary | ICD-10-CM

## 2013-05-12 DIAGNOSIS — F411 Generalized anxiety disorder: Secondary | ICD-10-CM

## 2013-05-12 DIAGNOSIS — G8929 Other chronic pain: Secondary | ICD-10-CM

## 2013-05-12 DIAGNOSIS — F32A Depression, unspecified: Secondary | ICD-10-CM

## 2013-05-12 DIAGNOSIS — K5900 Constipation, unspecified: Secondary | ICD-10-CM

## 2013-05-12 DIAGNOSIS — K219 Gastro-esophageal reflux disease without esophagitis: Secondary | ICD-10-CM

## 2013-05-12 DIAGNOSIS — F329 Major depressive disorder, single episode, unspecified: Secondary | ICD-10-CM

## 2013-05-12 DIAGNOSIS — F419 Anxiety disorder, unspecified: Secondary | ICD-10-CM

## 2013-05-12 DIAGNOSIS — I10 Essential (primary) hypertension: Secondary | ICD-10-CM

## 2013-05-12 NOTE — Progress Notes (Signed)
Patient ID: [name], female   DOB: 1916-03-24, 79 y.o.   MRN: [id number]       PROGRESS NOTE  DATE: 05/12/13  FACILITY: [facility]  LEVEL OF CARE: SNF (31)  Routine Visit  CHIEF COMPLAINT:  Manage hypertension, Anxiety, Depression, Chronic pain and dementia  HISTORY OF PRESENT ILLNESS:   REASSESSMENT OF ONGOING PROBLEM(S):  DEMENTIA: The dementia remaines stable and continues to function adequately in the current living environment with supervision.  The patient has had little changes in behavior. No complications noted from the medications presently being used.  DEPRESSION: The depression remains stable. Patient denies ongoing feelings of sadness, insomnia, anedhonia or lack of appetite. No complications reported from the medications currently being used. Staff do not report behavioral problems.  CONSTIPATION: The constipation remains stable. No complications from the medications presently being used. Patient denies ongoing constipation, abdominal pain, nausea or vomiting.  PAST MEDICAL HISTORY : Reviewed.  No changes.  CURRENT MEDICATIONS: Reviewed per [facility]  REVIEW OF SYSTEMS: Unobtainable due to dementia  PHYSICAL EXAMINATION  VS:  T 97.6    P 62   RR16    BP96/50      POX97%     WT85.4 (Lb)  GENERAL: no acute distress, thin body habitus EYES: Conjunctivae normal, sclerae normal, normal eyelids NECK: supple, trachea midline, no neck masses, no thyroid tenderness, no thyromegaly RESPIRATORY: breathing is even & unlabored, BS CTAB CARDIAC: RRR, no murmur,no extra heart sounds, no edema GI: abdomen soft, normal BS, no masses, no tenderness, no hepatomegaly, no splenomegaly PSYCHIATRIC: the patient is alert & disoriented, affect & behavior appropriate  LABS/RADIOLOGY: 04/28/13 hepatic panel normal cholesterol 155 LDL 56 HDL 39 triglycerides 298 8-14 BUN 32, creatinine 1.4 otherwise BMP normal 5/14 glucose 105, BUN 37, creatinine 1.17 otherwise BMP normal, CBC  normal 1/14 BUN 42, creatinine 1.55 otherwise BMP normal 11/13 total protein 5.8 otherwise CMP normal, Hb 9.9, mcv 90 ow cbc nl  ASSESSMENT/PLAN:  hypertension- off medications, well-controlled Alzheimer dementia-advanced; Hospice care GERD-stable. constipation-adequately controlled. Chronic Pain  - patient is comfortable;with Hospice care anxiety-stable.   CPT CODE: 14782

## 2013-05-23 ENCOUNTER — Other Ambulatory Visit: Payer: Self-pay | Admitting: *Deleted

## 2013-05-23 MED ORDER — MORPHINE SULFATE ER 30 MG PO TBCR: EXTENDED_RELEASE_TABLET | ORAL | Status: DC

## 2013-06-10 ENCOUNTER — Non-Acute Institutional Stay (SKILLED_NURSING_FACILITY): Payer: PRIVATE HEALTH INSURANCE | Admitting: Adult Health

## 2013-06-10 DIAGNOSIS — G8929 Other chronic pain: Secondary | ICD-10-CM

## 2013-06-10 DIAGNOSIS — F0280 Dementia in other diseases classified elsewhere without behavioral disturbance: Secondary | ICD-10-CM

## 2013-06-10 DIAGNOSIS — F32A Depression, unspecified: Secondary | ICD-10-CM

## 2013-06-10 DIAGNOSIS — K219 Gastro-esophageal reflux disease without esophagitis: Secondary | ICD-10-CM

## 2013-06-10 DIAGNOSIS — F329 Major depressive disorder, single episode, unspecified: Secondary | ICD-10-CM

## 2013-06-10 DIAGNOSIS — F419 Anxiety disorder, unspecified: Secondary | ICD-10-CM

## 2013-06-10 DIAGNOSIS — F411 Generalized anxiety disorder: Secondary | ICD-10-CM

## 2013-06-10 DIAGNOSIS — K5900 Constipation, unspecified: Secondary | ICD-10-CM

## 2013-06-10 NOTE — Progress Notes (Signed)
Patient ID: [name], female   DOB: 14-Jun-1916, 79 y.o.   MRN: [id number]                 PROGRESS NOTE  DATE: 06/10/13  FACILITY: [facility]  LEVEL OF CARE: SNF (31)  Routine Visit  CHIEF COMPLAINT:  Manage hypertension, Anxiety, Depression, Chronic pain and dementia  HISTORY OF PRESENT ILLNESS:   REASSESSMENT OF ONGOING PROBLEM(S):  GERD: pt's GERD is stable.  Denies ongoing heartburn, abd. Pain, nausea or vomiting.  Currently on a PPI & tolerates it without any adverse reactions.  ANXIETY: The anxiety remains stable. Patient denies ongoing anxiety or irritability. No complications reported from the medications currently being used.  DEMENTIA: The dementia remaines stable and continues to function adequately in the current living environment with supervision.  The patient has had little changes in behavior. No complications noted from the medications presently being used.   PAST MEDICAL HISTORY : Reviewed.  No changes.  CURRENT MEDICATIONS: Reviewed per [facility]  REVIEW OF SYSTEMS: Unobtainable due to dementia  PHYSICAL EXAMINATION  VS:  T96.8   P85   RR20   BP93/59          WT89 (Lb)  GENERAL: no acute distress, thin body habitus NECK: supple, trachea midline, no neck masses, no thyroid tenderness, no thyromegaly RESPIRATORY: breathing is even & unlabored, BS CTAB CARDIAC: RRR, no murmur,no extra heart sounds, no edema GI: abdomen soft, normal BS, no masses, no tenderness, no hepatomegaly, no splenomegaly PSYCHIATRIC: the patient is alert & disoriented, affect & behavior appropriate  LABS/RADIOLOGY: 04/28/13 hepatic panel normal cholesterol 155 LDL 56 HDL 39 triglycerides 298 8-14 BUN 32, creatinine 1.4 otherwise BMP normal 5/14 glucose 105, BUN 37, creatinine 1.17 otherwise BMP normal, CBC normal 1/14 BUN 42, creatinine 1.55 otherwise BMP normal 11/13 total protein 5.8 otherwise CMP normal, Hb 9.9, mcv 90 ow cbc nl  ASSESSMENT/PLAN:  Alzheimer  dementia-advanced; Hospice care GERD-stable. constipation-adequately controlled. Chronic Pain  - patient is comfortable;with Hospice care anxiety-stable. Depression - stable; continue Remeron  CPT CODE: [id number]

## 2013-07-15 ENCOUNTER — Non-Acute Institutional Stay (SKILLED_NURSING_FACILITY): Payer: PRIVATE HEALTH INSURANCE | Admitting: Adult Health

## 2013-07-15 DIAGNOSIS — G309 Alzheimer's disease, unspecified: Secondary | ICD-10-CM

## 2013-07-15 DIAGNOSIS — F411 Generalized anxiety disorder: Secondary | ICD-10-CM

## 2013-07-15 DIAGNOSIS — F32A Depression, unspecified: Secondary | ICD-10-CM

## 2013-07-15 DIAGNOSIS — F329 Major depressive disorder, single episode, unspecified: Secondary | ICD-10-CM

## 2013-07-15 DIAGNOSIS — F0280 Dementia in other diseases classified elsewhere without behavioral disturbance: Secondary | ICD-10-CM

## 2013-07-15 DIAGNOSIS — K219 Gastro-esophageal reflux disease without esophagitis: Secondary | ICD-10-CM

## 2013-07-15 DIAGNOSIS — F419 Anxiety disorder, unspecified: Secondary | ICD-10-CM

## 2013-07-15 DIAGNOSIS — K5900 Constipation, unspecified: Secondary | ICD-10-CM

## 2013-07-15 DIAGNOSIS — F3289 Other specified depressive episodes: Secondary | ICD-10-CM

## 2013-07-15 DIAGNOSIS — G8929 Other chronic pain: Secondary | ICD-10-CM

## 2013-07-16 NOTE — Progress Notes (Signed)
Patient ID: [name], female   DOB: 07/03/1915, 80 y.o.   MRN: [id number]                 PROGRESS NOTE  DATE: 07/15/13  FACILITY: [facility]  LEVEL OF CARE: SNF (31)  Routine Visit  CHIEF COMPLAINT:  Manage hypertension, Anxiety, Depression, Chronic pain and dementia  HISTORY OF PRESENT ILLNESS:   REASSESSMENT OF ONGOING PROBLEM(S):  DEPRESSION: The depression remains stable. Patient denies ongoing feelings of sadness, insomnia, anedhonia or lack of appetite. No complications reported from the medications currently being used. Staff do not report behavioral problems.  CHRONIC PAIN: The patient's chronic pain remains stable.  No complications reported from the medications presently being used.  Patient denies ongoing pain.  DEMENTIA: The dementia remaines stable and continues to function adequately in the current living environment with supervision.  The patient has had little changes in behavior. No complications noted from the medications presently being used.   PAST MEDICAL HISTORY : Reviewed.  No changes.  CURRENT MEDICATIONS: Reviewed per [facility]  REVIEW OF SYSTEMS: Unobtainable due to dementia  PHYSICAL EXAMINATION  VS:  T96.2  P51   RR20   BP108/82          WT85.8(Lb)  GENERAL: no acute distress, thin body habitus EYES: Lids open and close normally, PERRL, conjunctivae are clear NECK: supple, trachea midline, no neck masses, no thyroid tenderness, no thyromegaly RESPIRATORY: breathing is even & unlabored, BS CTAB CARDIAC: RRR, no murmur,no extra heart sounds, no edema GI: abdomen soft, normal BS, no masses, no tenderness, no hepatomegaly, no splenomegaly PSYCHIATRIC: the patient is alert & disoriented, affect & behavior appropriate  LABS/RADIOLOGY 05/05/13 WBC 4.4 hemoglobin 10.1 hematocrit 34 point so do 138 potassium 5.0 glucose 112 BUN 35 creatinine 1.2 calcium 9.4 total protein 6.0 albumin 3.4 AST 28 ALT 10: 04/28/13 hepatic panel normal cholesterol 155  LDL 56 HDL 39 triglycerides 298 04/08/13 sodium 137 potassium 4.4 glucose 63 DUN 26 creatinine 1.2 calcium 9.4 8-14 BUN 32, creatinine 1.4 otherwise BMP normal 5/14 glucose 105, BUN 37, creatinine 1.17 otherwise BMP normal, CBC normal 1/14 BUN 42, creatinine 1.55 otherwise BMP normal 11/13 total protein 5.8 otherwise CMP normal, Hb 9.9, mcv 90 ow cbc nl  ASSESSMENT/PLAN:  Alzheimer dementia-advanced; Hospice care GERD-stable. constipation-adequately controlled. Chronic Pain  - patient is comfortable;with Hospice care anxiety-stable. Depression - stable; continue Remeron   CPT CODE: [id number]

## 2013-08-15 ENCOUNTER — Encounter: Payer: Self-pay | Admitting: *Deleted

## 2013-08-17 ENCOUNTER — Non-Acute Institutional Stay (SKILLED_NURSING_FACILITY): Payer: PRIVATE HEALTH INSURANCE | Admitting: Adult Health

## 2013-08-17 DIAGNOSIS — K219 Gastro-esophageal reflux disease without esophagitis: Secondary | ICD-10-CM

## 2013-08-17 DIAGNOSIS — F419 Anxiety disorder, unspecified: Secondary | ICD-10-CM

## 2013-08-17 DIAGNOSIS — G8929 Other chronic pain: Secondary | ICD-10-CM

## 2013-08-17 DIAGNOSIS — G309 Alzheimer's disease, unspecified: Secondary | ICD-10-CM

## 2013-08-17 DIAGNOSIS — J309 Allergic rhinitis, unspecified: Secondary | ICD-10-CM

## 2013-08-17 DIAGNOSIS — K5900 Constipation, unspecified: Secondary | ICD-10-CM

## 2013-08-17 DIAGNOSIS — F3289 Other specified depressive episodes: Secondary | ICD-10-CM

## 2013-08-17 DIAGNOSIS — F411 Generalized anxiety disorder: Secondary | ICD-10-CM

## 2013-08-17 DIAGNOSIS — F329 Major depressive disorder, single episode, unspecified: Secondary | ICD-10-CM

## 2013-08-17 DIAGNOSIS — F0280 Dementia in other diseases classified elsewhere without behavioral disturbance: Secondary | ICD-10-CM

## 2013-08-17 DIAGNOSIS — F32A Depression, unspecified: Secondary | ICD-10-CM

## 2013-09-13 ENCOUNTER — Other Ambulatory Visit: Payer: Self-pay | Admitting: *Deleted

## 2013-09-13 MED ORDER — AMBULATORY NON FORMULARY MEDICATION: Status: DC

## 2013-09-13 NOTE — Telephone Encounter (Signed)
[facility] 

## 2013-09-15 ENCOUNTER — Other Ambulatory Visit: Payer: Self-pay | Admitting: *Deleted

## 2013-09-15 MED ORDER — AMBULATORY NON FORMULARY MEDICATION: Status: DC

## 2013-09-15 MED ORDER — LORAZEPAM 2 MG/ML IJ SOLN: INTRAMUSCULAR | Status: DC

## 2013-09-15 NOTE — Telephone Encounter (Signed)
[facility] 

## 2013-09-23 ENCOUNTER — Encounter: Payer: Self-pay | Admitting: Adult Health

## 2013-09-23 ENCOUNTER — Non-Acute Institutional Stay (SKILLED_NURSING_FACILITY): Payer: PRIVATE HEALTH INSURANCE | Admitting: Adult Health

## 2013-09-23 DIAGNOSIS — F32A Depression, unspecified: Secondary | ICD-10-CM

## 2013-09-23 DIAGNOSIS — F3289 Other specified depressive episodes: Secondary | ICD-10-CM

## 2013-09-23 DIAGNOSIS — G309 Alzheimer's disease, unspecified: Secondary | ICD-10-CM

## 2013-09-23 DIAGNOSIS — K5900 Constipation, unspecified: Secondary | ICD-10-CM

## 2013-09-23 DIAGNOSIS — F329 Major depressive disorder, single episode, unspecified: Secondary | ICD-10-CM

## 2013-09-23 DIAGNOSIS — G8929 Other chronic pain: Secondary | ICD-10-CM

## 2013-09-23 DIAGNOSIS — N390 Urinary tract infection, site not specified: Secondary | ICD-10-CM | POA: Insufficient documentation

## 2013-09-23 DIAGNOSIS — F419 Anxiety disorder, unspecified: Secondary | ICD-10-CM

## 2013-09-23 DIAGNOSIS — F0280 Dementia in other diseases classified elsewhere without behavioral disturbance: Secondary | ICD-10-CM

## 2013-09-23 DIAGNOSIS — J309 Allergic rhinitis, unspecified: Secondary | ICD-10-CM

## 2013-09-23 DIAGNOSIS — K219 Gastro-esophageal reflux disease without esophagitis: Secondary | ICD-10-CM

## 2013-09-23 DIAGNOSIS — F411 Generalized anxiety disorder: Secondary | ICD-10-CM

## 2013-09-23 NOTE — Progress Notes (Signed)
Patient ID: [name], female   DOB: 1916/03/21, 80 y.o.   MRN: [id number]                 PROGRESS NOTE  DATE: 08/17/13  FACILITY: [facility]  LEVEL OF CARE: SNF (31)  Routine Visit  CHIEF COMPLAINT:  Manage Anxiety, Depression, Chronic pain and dementia  HISTORY OF PRESENT ILLNESS:   REASSESSMENT OF ONGOING PROBLEM(S):  ALLERGIC RHINITIS: Allergic rhinitis remains stable.  Patient denies ongoing symptoms such as runny nose sneezing or tearing. No complications reported from the current medication(s) being used.  GERD: pt's GERD is stable.  Denies ongoing heartburn, abd. Pain, nausea or vomiting.  Currently on a PPI & tolerates it without any adverse reactions.  CONSTIPATION: The constipation remains stable. No complications from the medications presently being used. Patient denies ongoing constipation, abdominal pain, nausea or vomiting.   PAST MEDICAL HISTORY : Reviewed.  No changes.  CURRENT MEDICATIONS: Reviewed per [facility]  REVIEW OF SYSTEMS: Unobtainable due to dementia  PHYSICAL EXAMINATION  GENERAL: no acute distress, thin body habitus NECK: supple, trachea midline, no neck masses, no thyroid tenderness, no thyromegaly RESPIRATORY: breathing is even & unlabored, BS CTAB CARDIAC: RRR, no murmur,no extra heart sounds, no edema GI: abdomen soft, normal BS, no masses, no tenderness, no hepatomegaly, no splenomegaly PSYCHIATRIC: the patient is alert & disoriented, affect & behavior appropriate  LABS/RADIOLOGY 05/05/13 WBC 4.4 hemoglobin 10.1 hematocrit 34 point so do 138 potassium 5.0 glucose 112 BUN 35 creatinine 1.2 calcium 9.4 total protein 6.0 albumin 3.4 AST 28 ALT 10: 04/28/13 hepatic panel normal cholesterol 155 LDL 56 HDL 39 triglycerides 298 04/08/13 sodium 137 potassium 4.4 glucose 63 DUN 26 creatinine 1.2 calcium 9.4 8-14 BUN 32, creatinine 1.4 otherwise BMP normal 5/14 glucose 105, BUN 37, creatinine 1.17 otherwise BMP normal, CBC normal 1/14 BUN 42,  creatinine 1.55 otherwise BMP normal 11/13 total protein 5.8 otherwise CMP normal, Hb 9.9, mcv 90 ow cbc nl  ASSESSMENT/PLAN:  Alzheimer dementia-advanced; Hospice care GERD-stable; continue Prilosec constipation-adequately controlled. Chronic Pain  - patient is comfortable; continue Morphine Sulfate;with Hospice care anxiety-stable; continue Ativan gel Depression - stable; continue Remeron Allergic Rhinitis - continue Fluticasone  CPT CODE: [id number]   Seth Bake - NP [facility] [phone]

## 2013-09-23 NOTE — Progress Notes (Signed)
Patient ID: [name], female   DOB: 1915-12-30, 80 y.o.   MRN: [id number]                 PROGRESS NOTE  DATE: 09/23/13  FACILITY: [facility]  LEVEL OF CARE: SNF (31)  Routine Visit  CHIEF COMPLAINT:  Manage  UTI, Anxiety, Depression, Chronic pain and dementia  HISTORY OF PRESENT ILLNESS:   REASSESSMENT OF ONGOING PROBLEM(S):  DEPRESSION: The depression remains stable. Patient denies ongoing feelings of sadness, insomnia, anedhonia or lack of appetite. No complications reported from the medications currently being used. Staff do not report behavioral problems.  GERD: pt's GERD is stable.  Denies ongoing heartburn, abd. Pain, nausea or vomiting.  Currently on a PPI & tolerates it without any adverse reactions.  CONSTIPATION: The constipation remains stable. No complications from the medications presently being used. Patient denies ongoing constipation, abdominal pain, nausea or vomiting.   PAST MEDICAL HISTORY : Reviewed.  No changes.  CURRENT MEDICATIONS: Reviewed per [facility]  REVIEW OF SYSTEMS: Unobtainable due to dementia  PHYSICAL EXAMINATION  GENERAL: no acute distress, thin body habitus NECK: supple, trachea midline, no neck masses, no thyroid tenderness, no thyromegaly LYMPHATICS: no adenopathy on supraclavicular area RESPIRATORY: breathing is even & unlabored, BS CTAB CARDIAC: RRR, no murmur,no extra heart sounds, no edema GI: abdomen soft, normal BS, no masses, no tenderness, no hepatomegaly, no splenomegaly PSYCHIATRIC: the patient is alert & disoriented, affect & behavior appropriate  LABS/RADIOLOGY 09/20/13 Urine culture > 100,000 colonies/ml E. coli 05/05/13 WBC 4.4 hemoglobin 10.1 hematocrit 34 point so do 138 potassium 5.0 glucose 112 BUN 35 creatinine 1.2 calcium 9.4 total protein 6.0 albumin 3.4 AST 28 ALT 10: 04/28/13 hepatic panel normal cholesterol 155 LDL 56 HDL 39 triglycerides 298 04/08/13 sodium 137 potassium 4.4 glucose 63 DUN 26 creatinine  1.2 calcium 9.4 8-14 BUN 32, creatinine 1.4 otherwise BMP normal 5/14 glucose 105, BUN 37, creatinine 1.17 otherwise BMP normal, CBC normal 1/14 BUN 42, creatinine 1.55 otherwise BMP normal 11/13 total protein 5.8 otherwise CMP normal, Hb 9.9, mcv 90 ow cbc nl  ASSESSMENT/PLAN:  UTI - start Macrobid 100 mg PO BID x 7 days Alzheimer dementia-advanced; Hospice care GERD-stable; continue Prilosec constipation-adequately controlled. Chronic Pain  - patient is comfortable; continue Morphine Sulfate;with Hospice care anxiety-stable; continue Ativan gel Depression - stable; continue Remeron Allergic Rhinitis - continue Fluticasone   CPT CODE: [id number]   Seth Bake - NP [facility] [phone]

## 2013-10-03 ENCOUNTER — Other Ambulatory Visit: Payer: Self-pay | Admitting: *Deleted

## 2013-10-03 MED ORDER — MORPHINE SULFATE ER 30 MG PO TBCR: EXTENDED_RELEASE_TABLET | ORAL | Status: DC

## 2013-10-03 NOTE — Telephone Encounter (Signed)
[facility] 

## 2013-11-18 ENCOUNTER — Encounter: Payer: Self-pay | Admitting: Adult Health

## 2013-11-18 ENCOUNTER — Non-Acute Institutional Stay (SKILLED_NURSING_FACILITY): Payer: PRIVATE HEALTH INSURANCE | Admitting: Adult Health

## 2013-11-18 DIAGNOSIS — F32A Depression, unspecified: Secondary | ICD-10-CM

## 2013-11-18 DIAGNOSIS — F329 Major depressive disorder, single episode, unspecified: Secondary | ICD-10-CM

## 2013-11-18 DIAGNOSIS — F419 Anxiety disorder, unspecified: Secondary | ICD-10-CM

## 2013-11-18 DIAGNOSIS — K219 Gastro-esophageal reflux disease without esophagitis: Secondary | ICD-10-CM

## 2013-11-18 DIAGNOSIS — K5900 Constipation, unspecified: Secondary | ICD-10-CM

## 2013-11-18 DIAGNOSIS — F3289 Other specified depressive episodes: Secondary | ICD-10-CM

## 2013-11-18 DIAGNOSIS — F0280 Dementia in other diseases classified elsewhere without behavioral disturbance: Secondary | ICD-10-CM

## 2013-11-18 DIAGNOSIS — G309 Alzheimer's disease, unspecified: Secondary | ICD-10-CM

## 2013-11-18 DIAGNOSIS — G8929 Other chronic pain: Secondary | ICD-10-CM

## 2013-11-18 DIAGNOSIS — F411 Generalized anxiety disorder: Secondary | ICD-10-CM

## 2013-11-18 DIAGNOSIS — N390 Urinary tract infection, site not specified: Secondary | ICD-10-CM

## 2013-11-18 NOTE — Progress Notes (Signed)
Patient ID: [name], female   DOB: 1916/04/01, 80 y.o.   MRN: [id number]                 PROGRESS NOTE  DATE:11/18/13  FACILITY: [facility]  LEVEL OF CARE: SNF (31)  Routine Visit  CHIEF COMPLAINT:  Manage  UTI, Anxiety, Depression, Chronic pain and dementia  HISTORY OF PRESENT ILLNESS: This is a 80 year old female who was noted to be agitated so UA done. Urine culture shows > 100,000 CFU/ml E. Coli. No fever noted.  REASSESSMENT OF ONGOING PROBLEM(S):  CHRONIC PAIN: The patient's chronic pain remains stable.  No complications reported from the medications presently being used.  Patient denies ongoing pain.  ANXIETY: The anxiety remains stable. Patient denies ongoing anxiety or irritability. No complications reported from the medications currently being used.  GERD: pt's GERD is stable.  Denies ongoing heartburn, abd. Pain, nausea or vomiting.  Currently on a PPI & tolerates it without any adverse reactions.  CONSTIPATION: The constipation remains stable. No complications from the medications presently being used. Patient denies ongoing constipation, abdominal pain, nausea or vomiting.   PAST MEDICAL HISTORY : Reviewed.  No changes.  CURRENT MEDICATIONS: Reviewed per [facility]  REVIEW OF SYSTEMS: Unobtainable due to dementia  PHYSICAL EXAMINATION  GENERAL: no acute distress, thin body habitus NECK: supple, trachea midline, no neck masses, no thyroid tenderness, no thyromegaly RESPIRATORY: breathing is even & unlabored, BS CTAB CARDIAC: RRR, no murmur,no extra heart sounds, no edema GI: abdomen soft, normal BS, no masses, no tenderness, no hepatomegaly, no splenomegaly EXTREMITIES: able to move all 4 extremities but has BLE generalized weakness; uses wheelchair PSYCHIATRIC: the patient is alert & disoriented, agitated  LABS/RADIOLOGY 11/15/13  Urine culture > 100,000 CFU/ml E. coli 09/20/13 Urine culture > 100,000 colonies/ml E. coli 05/05/13 WBC 4.4 hemoglobin 10.1  hematocrit 34 point so do 138 potassium 5.0 glucose 112 BUN 35 creatinine 1.2 calcium 9.4 total protein 6.0 albumin 3.4 AST 28 ALT 10: 04/28/13 hepatic panel normal cholesterol 155 LDL 56 HDL 39 triglycerides 298 04/08/13 sodium 137 potassium 4.4 glucose 63 DUN 26 creatinine 1.2 calcium 9.4 8-14 BUN 32, creatinine 1.4 otherwise BMP normal 5/14 glucose 105, BUN 37, creatinine 1.17 otherwise BMP normal, CBC normal 1/14 BUN 42, creatinine 1.55 otherwise BMP normal 11/13 total protein 5.8 otherwise CMP normal, Hb 9.9, mcv 90 ow cbc nl  ASSESSMENT/PLAN:  UTI - start Bactrim DS 1 tab PO BID X 7 days Alzheimer dementia-advanced; Hospice care GERD-stable; continue Prilosec constipation-adequately controlled. Chronic Pain  - patient is comfortable; continue Morphine Sulfate;with Hospice care anxiety-stable; continue Ativan gel Depression - stable; continue Remeron Allergic Rhinitis - continue Fluticasone   CPT CODE: [id number]   Seth Bake - NP [facility] [phone]

## 2013-12-14 LAB — HEPATIC FUNCTION PANEL
ALT: 6 U/L — AB (ref 7–35)
AST: 15 U/L (ref 13–35)
Bilirubin, Total: 0.3 mg/dL

## 2013-12-14 LAB — CBC AND DIFFERENTIAL
HEMATOCRIT: 30 % — AB (ref 36–46)
Hemoglobin: 9 g/dL — AB (ref 12.0–16.0)
Platelets: 218 10*3/uL (ref 150–399)
WBC: 4.5 10^3/mL

## 2013-12-14 LAB — BASIC METABOLIC PANEL
BUN: 35 mg/dL — AB (ref 4–21)
GLUCOSE: 76 mg/dL
POTASSIUM: 4.3 mmol/L (ref 3.4–5.3)
Sodium: 137 mmol/L (ref 137–147)

## 2013-12-21 ENCOUNTER — Non-Acute Institutional Stay (SKILLED_NURSING_FACILITY): Payer: PRIVATE HEALTH INSURANCE | Admitting: Internal Medicine

## 2013-12-21 DIAGNOSIS — F0280 Dementia in other diseases classified elsewhere without behavioral disturbance: Secondary | ICD-10-CM

## 2013-12-21 DIAGNOSIS — K5900 Constipation, unspecified: Secondary | ICD-10-CM

## 2013-12-21 DIAGNOSIS — G8929 Other chronic pain: Secondary | ICD-10-CM

## 2013-12-21 DIAGNOSIS — K219 Gastro-esophageal reflux disease without esophagitis: Secondary | ICD-10-CM

## 2013-12-21 DIAGNOSIS — G309 Alzheimer's disease, unspecified: Principal | ICD-10-CM

## 2013-12-22 NOTE — Progress Notes (Signed)
         PROGRESS NOTE  DATE: 12/21/13  FACILITY: [facility]  LEVEL OF CARE: SNF  Routine Visit  CHIEF COMPLAINT:  Manage GERD, constipation and dementia  HISTORY OF PRESENT ILLNESS:  REASSESSMENT OF ONGOING PROBLEM(S):  DEMENTIA: The dementia remaines stable and continues to function adequately in the current living environment with supervision.  The patient has had little changes in behavior. No complications noted from the medications presently being used. Patient does not follow commands.  GERD: pt's GERD is stable.  Staff Deny ongoing heartburn, abd. Pain, nausea or vomiting.  Currently on a PPI & tolerates it without any adverse reactions.  CONSTIPATION: The constipation remains stable. No complications from the medications presently being used. Staff deny ongoing constipation, abdominal pain, nausea or vomiting.  PAST MEDICAL HISTORY : Reviewed.  No changes.  CURRENT MEDICATIONS: Reviewed per [facility]  REVIEW OF SYSTEMS: Unobtainable due to dementia  PHYSICAL EXAMINATION  GENERAL: no acute distress, thin body habitus EYES: Normal sclerae, normal conjunctivae, no discharge NECK: supple, trachea midline, no neck masses, no thyroid tenderness, no thyromegaly LYMPHATICS: No cervical lymphadenopathy, no supraclavicular lymphadenopathy RESPIRATORY: breathing is even & unlabored, BS CTAB CARDIAC: RRR, no murmur,no extra heart sounds, no edema GI: abdomen soft, normal BS, no masses, no tenderness, no hepatomegaly, no splenomegaly PSYCHIATRIC: the patient is alert & disoriented, affect & behavior appropriate  LABS/RADIOLOGY: 7-15 BUN 35, total protein 5.6, albumin 3.1 otherwise CMP normal, hemoglobin 9, MCV 97.4 otherwise CBC normal 8-14 BUN 32, creatinine 1.4 otherwise BMP normal  5/14 glucose 105, BUN 37, creatinine 1.17 otherwise BMP normal, CBC normal  1/14 BUN 42, creatinine 1.55 otherwise BMP normal 11/13 total protein 5.8 otherwise CMP normal, Hb 9.9, mcv 90 ow cbc  nl  ASSESSMENT/PLAN:  Alzheimer dementia-advanced. GERD-stable. constipation-adequately controlled. hip pain-patient is comfortable. Allergic rhinitis-well-controlled Anemia-stable Depression-on Remeron anxiety-stable.  CPT CODE: [id number]  Maveric Debono Y. Durwin Reges, [place] [phone]

## 2014-01-07 ENCOUNTER — Encounter (HOSPITAL_COMMUNITY): Payer: Self-pay | Admitting: Emergency Medicine

## 2014-01-07 ENCOUNTER — Emergency Department (HOSPITAL_COMMUNITY)
Admission: EM | Admit: 2014-01-07 | Discharge: 2014-01-07 | Disposition: A | Discharge status: Home / Self Care | Payer: Medicare Other | Attending: Emergency Medicine | Admitting: Emergency Medicine

## 2014-01-07 ENCOUNTER — Emergency Department (HOSPITAL_COMMUNITY): Payer: Medicare Other

## 2014-01-07 DIAGNOSIS — Z853 Personal history of malignant neoplasm of breast: Secondary | ICD-10-CM | POA: Insufficient documentation

## 2014-01-07 DIAGNOSIS — F17200 Nicotine dependence, unspecified, uncomplicated: Secondary | ICD-10-CM | POA: Insufficient documentation

## 2014-01-07 DIAGNOSIS — R296 Repeated falls: Secondary | ICD-10-CM | POA: Diagnosis not present

## 2014-01-07 DIAGNOSIS — W19XXXA Unspecified fall, initial encounter: Secondary | ICD-10-CM

## 2014-01-07 DIAGNOSIS — G309 Alzheimer's disease, unspecified: Secondary | ICD-10-CM | POA: Diagnosis not present

## 2014-01-07 DIAGNOSIS — F3289 Other specified depressive episodes: Secondary | ICD-10-CM | POA: Diagnosis not present

## 2014-01-07 DIAGNOSIS — J449 Chronic obstructive pulmonary disease, unspecified: Secondary | ICD-10-CM | POA: Insufficient documentation

## 2014-01-07 DIAGNOSIS — I129 Hypertensive chronic kidney disease with stage 1 through stage 4 chronic kidney disease, or unspecified chronic kidney disease: Secondary | ICD-10-CM | POA: Diagnosis not present

## 2014-01-07 DIAGNOSIS — S0101XA Laceration without foreign body of scalp, initial encounter: Secondary | ICD-10-CM

## 2014-01-07 DIAGNOSIS — N182 Chronic kidney disease, stage 2 (mild): Secondary | ICD-10-CM | POA: Diagnosis not present

## 2014-01-07 DIAGNOSIS — Z8739 Personal history of other diseases of the musculoskeletal system and connective tissue: Secondary | ICD-10-CM | POA: Diagnosis not present

## 2014-01-07 DIAGNOSIS — Y929 Unspecified place or not applicable: Secondary | ICD-10-CM | POA: Insufficient documentation

## 2014-01-07 DIAGNOSIS — F0390 Unspecified dementia without behavioral disturbance: Secondary | ICD-10-CM | POA: Insufficient documentation

## 2014-01-07 DIAGNOSIS — S0100XA Unspecified open wound of scalp, initial encounter: Secondary | ICD-10-CM | POA: Diagnosis not present

## 2014-01-07 DIAGNOSIS — S0990XA Unspecified injury of head, initial encounter: Secondary | ICD-10-CM

## 2014-01-07 DIAGNOSIS — I2510 Atherosclerotic heart disease of native coronary artery without angina pectoris: Secondary | ICD-10-CM | POA: Diagnosis not present

## 2014-01-07 DIAGNOSIS — K5909 Other constipation: Secondary | ICD-10-CM | POA: Diagnosis not present

## 2014-01-07 DIAGNOSIS — Z79899 Other long term (current) drug therapy: Secondary | ICD-10-CM | POA: Diagnosis not present

## 2014-01-07 DIAGNOSIS — J4489 Other specified chronic obstructive pulmonary disease: Secondary | ICD-10-CM | POA: Insufficient documentation

## 2014-01-07 DIAGNOSIS — F329 Major depressive disorder, single episode, unspecified: Secondary | ICD-10-CM | POA: Insufficient documentation

## 2014-01-07 DIAGNOSIS — Y939 Activity, unspecified: Secondary | ICD-10-CM | POA: Diagnosis not present

## 2014-01-07 DIAGNOSIS — IMO0002 Reserved for concepts with insufficient information to code with codable children: Secondary | ICD-10-CM | POA: Diagnosis not present

## 2014-01-07 DIAGNOSIS — F0280 Dementia in other diseases classified elsewhere without behavioral disturbance: Secondary | ICD-10-CM | POA: Diagnosis not present

## 2014-01-07 DIAGNOSIS — K219 Gastro-esophageal reflux disease without esophagitis: Secondary | ICD-10-CM | POA: Insufficient documentation

## 2014-01-07 DIAGNOSIS — I252 Old myocardial infarction: Secondary | ICD-10-CM | POA: Insufficient documentation

## 2014-01-07 DIAGNOSIS — E119 Type 2 diabetes mellitus without complications: Secondary | ICD-10-CM | POA: Insufficient documentation

## 2014-01-07 DIAGNOSIS — F0391 Unspecified dementia with behavioral disturbance: Secondary | ICD-10-CM

## 2014-01-07 DIAGNOSIS — N179 Acute kidney failure, unspecified: Secondary | ICD-10-CM | POA: Insufficient documentation

## 2014-01-07 LAB — I-STAT CHEM 8, ED
BUN: 54 mg/dL — AB (ref 6–23)
Calcium, Ion: 1.21 mmol/L (ref 1.13–1.30)
Chloride: 99 mEq/L (ref 96–112)
Creatinine, Ser: 1.8 mg/dL — ABNORMAL HIGH (ref 0.50–1.10)
Glucose, Bld: 84 mg/dL (ref 70–99)
HCT: 34 % — ABNORMAL LOW (ref 36.0–46.0)
Hemoglobin: 11.6 g/dL — ABNORMAL LOW (ref 12.0–15.0)
POTASSIUM: 4.2 meq/L (ref 3.7–5.3)
SODIUM: 134 meq/L — AB (ref 137–147)
TCO2: 23 mmol/L (ref 0–100)

## 2014-01-07 LAB — URINALYSIS, ROUTINE W REFLEX MICROSCOPIC
BILIRUBIN URINE: NEGATIVE
Glucose, UA: NEGATIVE mg/dL
HGB URINE DIPSTICK: NEGATIVE
KETONES UR: NEGATIVE mg/dL
Leukocytes, UA: NEGATIVE
Nitrite: NEGATIVE
Protein, ur: NEGATIVE mg/dL
SPECIFIC GRAVITY, URINE: 1.013 (ref 1.005–1.030)
UROBILINOGEN UA: 0.2 mg/dL (ref 0.0–1.0)
pH: 5 (ref 5.0–8.0)

## 2014-01-07 MED ORDER — POLYETHYLENE GLYCOL 3350 17 G PO PACK: 17.0000 g | PACK | Freq: Every day | ORAL | Status: AC

## 2014-01-07 NOTE — Discharge Instructions (Signed)
If you were given medicines take as directed.  If you are on coumadin or contraceptives realize their levels and effectiveness is altered by many different medicines.  If you have any reaction (rash, tongues swelling, other) to the medicines stop taking and see a physician.   Please follow up as directed and return to the ER or see a physician for new or worsening symptoms.  Thank you. Filed Vitals:   01/07/14 0408  BP: 98/52  Temp: 97.6 F (36.4 C)  TempSrc: Oral  Resp: 20

## 2014-01-07 NOTE — ED Notes (Signed)
[facility] at bedside 

## 2014-01-07 NOTE — ED Notes (Addendum)
Lab draw: LIGHT GREEN , LAVENDER, AND BLUE TOP SENT TO LAB IF NEEDED

## 2014-01-07 NOTE — ED Notes (Signed)
Per EMS pt comes from [facility] where facility states they walked in the room and found pt on the floor. Staff unsure what pt hit head on. Pt alert and oriented per her norm.

## 2014-01-07 NOTE — ED Notes (Signed)
Bed: IN86 Expected date:  Expected time:  Means of arrival:  Comments: EMS 80yo Fall, laceration

## 2014-01-07 NOTE — ED Notes (Signed)
Report called to facility. [facility] contacted for pt transport.

## 2014-01-07 NOTE — ED Notes (Signed)
Pt awaiting [facility] arrival for transportation.

## 2014-01-07 NOTE — ED Provider Notes (Signed)
CSN: [id number]     Arrival date & time 01/07/14  3235 History   First MD Initiated Contact with Patient 01/07/14 0405     Chief Complaint  Patient presents with  . Fall     (Consider location/radiation/quality/duration/timing/severity/associated sxs/prior Treatment) HPI Comments: 80 year old female with Alzheimer's, reflux, urine infection history presents after unwitnessed fall in her room, patient was found on the floor with evidence of head injury. Patient is at her baseline Alzheimer's per report. No recent fevers or known infection. Difficult exam and history as patient dementia. Mild laceration left posterior scalp.  Patient is a 80 y.o. female presenting with fall. The history is provided by the nursing home.  Fall    Past Medical History  Diagnosis Date  . GERD (gastroesophageal reflux disease)   . Chronic kidney disease     Chronic Stage II  . Hypertension   . Osteoporosis   . Depression   . Myocardial infarction   . Cancer     Breast  . Substance abuse     Alcoholism   . Alzheimer's disease 07/15/2010  . COPD (chronic obstructive pulmonary disease)   . Diabetes mellitus without complication   . Anemia   . FTT (failure to thrive) in adult   . CAD (coronary artery disease)    Past Surgical History  Procedure Laterality Date  . Hernia repair     History reviewed. No pertinent family history. History  Substance Use Topics  . Smoking status: Current Every Day Smoker  . Smokeless tobacco: Not on file  . Alcohol Use: Not on file   OB History   Grav Para Term Preterm Abortions TAB SAB Ect Mult Living                 Review of Systems  Unable to perform ROS: Dementia      Allergies  Asa  Home Medications   Prior to Admission medications   Medication Sig Start Date End Date Taking? Authorizing Provider  AMBULATORY NON FORMULARY MEDICATION Lorazepam 0.5mg /ml Gel Sig: Apply 58ml topically once daily 09/13/13   Estill Dooms, MD  AMBULATORY NON  FORMULARY MEDICATION Lorazepam 0.5mg /ml Gel Sig: Apply 32ml topically daily; Administer 59ml topically times 1 as needed between 10pm and 7am only 09/15/13   Tiffany L Reed, DO  Artificial Tear Ointment (AKWA TEARS OP) Apply 2 drops to eye 3 (three) times daily. Instill  2 drops into each eye three times daily as needed for dryness/irritation to eyes. *Wait 5 minutes between 2 eye meds*    Historical Provider, MD  fluticasone (FLONASE) 50 MCG/ACT nasal spray Place 2 sprays into both nostrils daily.    Historical Provider, MD  lactulose, encephalopathy, (ENULOSE) 10 GM/15ML SOLN Take 30 g by mouth daily. For constipation    Historical Provider, MD  LORazepam (ATIVAN) 0.5 MG tablet Take 0.5 mg by mouth every 8 (eight) hours as needed for anxiety.    Historical Provider, MD  LORazepam (ATIVAN) 2 MG/ML injection Administer 0.71ml intramuscularly every 6 hours as needed for agitation 09/15/13   Tiffany L Reed, DO  memantine (NAMENDA) 10 MG tablet Take 10 mg by mouth 2 (two) times daily. For dementia    Historical Provider, MD  mirtazapine (REMERON) 15 MG tablet Take 15 mg by mouth at bedtime. For depression    Historical Provider, MD  morphine (MS CONTIN) 30 MG 12 hr tablet Take one tablet by mouth twice daily. Do not crush 10/03/13   Gayland Curry, DO  Multiple Vitamins-Minerals (DECUBI-VITE) CAPS Take 1 capsule by mouth daily. For wound healing    Historical Provider, MD  omeprazole (PRILOSEC) 20 MG capsule Take 20 mg by mouth at bedtime. For GERD    Historical Provider, MD  OXYGEN-HELIUM IN Inhale 2 L into the lungs as needed. For SOB and SAT >88%    Historical Provider, MD  polyethylene glycol (MIRALAX / GLYCOLAX) packet Take 17 g by mouth daily. For constipation    Historical Provider, MD  Sennosides-Docusate Sodium (SENNA-S PO) Take 2 tablets by mouth at bedtime.    Historical Provider, MD   BP 98/52  Temp(Src) 97.6 F (36.4 C) (Oral)  Resp 20 Physical Exam  Nursing note and vitals  reviewed. Constitutional: She appears well-developed and well-nourished.  HENT:  Head: Normocephalic.  Mild dry mucous membranes  Eyes: Right eye exhibits no discharge. Left eye exhibits no discharge.  Neck: Normal range of motion. Neck supple. No tracheal deviation present.  Cardiovascular: Normal rate and regular rhythm.   Pulmonary/Chest: Effort normal and breath sounds normal.  Abdominal: Soft. She exhibits no distension. There is no tenderness. There is no guarding.  Musculoskeletal: She exhibits no edema.  Neurological: She is alert.  Patient moves extremities equal bilateral with equal strength, flexion of knees and hips. Dementia. No meningismus. Pupils equal bilateral and horizontal eye movements intact. Difficult neuro exam due to dementia.  Skin: Skin is warm. No rash noted.   1 cm laceration posterior mid scalp no gaping or significant bleeding.  Psychiatric:  Mild agitation, dementia, mild flexion of knees and hips bilateral. Neck supple no meningismus.    ED Course  Procedures (including critical care time) Labs Review Labs Reviewed  I-STAT CHEM 8, ED - Abnormal; Notable for the following:    Sodium 134 (*)    BUN 54 (*)    Creatinine, Ser 1.80 (*)    Hemoglobin 11.6 (*)    HCT 34.0 (*)    All other components within normal limits  URINE CULTURE  URINALYSIS, ROUTINE W REFLEX MICROSCOPIC    Imaging Review Dg Pelvis 1-2 Views  01/07/2014   CLINICAL DATA:  FALL  EXAM: PELVIS - 1-2 VIEW  COMPARISON:  Prior CT from 07/13/2010  FINDINGS: A left total hip arthroplasty is in place. The acetabular and femoral components appear are grossly aligned and in stable position. No periprosthetic fracture or lucency identified. The right hip is grossly intact.  Bony pelvis is intact.  SI joints approximated.  Degenerative changes are within the lower lumbar spine. Diffuse osteopenia present.  Large amount of stool overlies the distal colon and rectal vault, suggesting constipation.   IMPRESSION: 1. No acute fracture or dislocation. 2. Left hip total arthroplasty in place without complication. 3. Large amount of stool within the rectal vault, compatible with constipation/fecal impaction.   Electronically Signed   By: Jeannine Boga M.D.   On: 01/07/2014 06:36   Ct Head Wo Contrast  01/07/2014   CLINICAL DATA:  Fall  EXAM: CT HEAD WITHOUT CONTRAST  CT CERVICAL SPINE WITHOUT CONTRAST  TECHNIQUE: Multidetector CT imaging of the head and cervical spine was performed following the standard protocol without intravenous contrast. Multiplanar CT image reconstructions of the cervical spine were also generated.  COMPARISON:  None.  FINDINGS: CT HEAD FINDINGS  Generalized cerebral atrophy with chronic microvascular ischemic disease is present.  There is no acute intracranial hemorrhage or infarct. No mass lesion or midline shift. Gray-white matter differentiation is well maintained. Ventricles are normal in size  without evidence of hydrocephalus. No extra-axial fluid collection.  The calvarium is intact.  Orbital soft tissues are within normal limits.  The paranasal sinuses and mastoid air cells are well pneumatized and free of fluid.  Scalp soft tissues are unremarkable.  CT CERVICAL SPINE FINDINGS  There is 3 mm of anterior listhesis of C4 on C5 with trace anterior listhesis of C3 and C4, likely chronic in nature. Otherwise vertebral bodies are normally aligned. Vertebral body heights are preserved. No acute fracture listhesis. Normal C1-2 articulations are intact. No prevertebral soft tissue swelling.  Extensive multilevel degenerative disc disease seen throughout the cervical spine, most severe at C6-7. There is partial ankylosis of the C5 and C6 vertebral bodies. Multilevel facet arthrosis noted.  Visualized soft tissues of the neck are within normal limits. Visualized lung apices are clear without evidence of apical pneumothorax.  IMPRESSION: CT BRAIN:  1. No acute intracranial process. 2.  Atrophy with chronic microvascular ischemic disease.  CT CERVICAL SPINE:  1. No acute traumatic injury within the cervical spine. 2. Extensive multilevel degenerative disc disease with chronic 3 mm anterolisthesis of C4 on C5.   Electronically Signed   By: Jeannine Boga M.D.   On: 01/07/2014 06:23   Ct Cervical Spine Wo Contrast  01/07/2014   CLINICAL DATA:  Fall  EXAM: CT HEAD WITHOUT CONTRAST  CT CERVICAL SPINE WITHOUT CONTRAST  TECHNIQUE: Multidetector CT imaging of the head and cervical spine was performed following the standard protocol without intravenous contrast. Multiplanar CT image reconstructions of the cervical spine were also generated.  COMPARISON:  None.  FINDINGS: CT HEAD FINDINGS  Generalized cerebral atrophy with chronic microvascular ischemic disease is present.  There is no acute intracranial hemorrhage or infarct. No mass lesion or midline shift. Gray-white matter differentiation is well maintained. Ventricles are normal in size without evidence of hydrocephalus. No extra-axial fluid collection.  The calvarium is intact.  Orbital soft tissues are within normal limits.  The paranasal sinuses and mastoid air cells are well pneumatized and free of fluid.  Scalp soft tissues are unremarkable.  CT CERVICAL SPINE FINDINGS  There is 3 mm of anterior listhesis of C4 on C5 with trace anterior listhesis of C3 and C4, likely chronic in nature. Otherwise vertebral bodies are normally aligned. Vertebral body heights are preserved. No acute fracture listhesis. Normal C1-2 articulations are intact. No prevertebral soft tissue swelling.  Extensive multilevel degenerative disc disease seen throughout the cervical spine, most severe at C6-7. There is partial ankylosis of the C5 and C6 vertebral bodies. Multilevel facet arthrosis noted.  Visualized soft tissues of the neck are within normal limits. Visualized lung apices are clear without evidence of apical pneumothorax.  IMPRESSION: CT BRAIN:  1. No  acute intracranial process. 2. Atrophy with chronic microvascular ischemic disease.  CT CERVICAL SPINE:  1. No acute traumatic injury within the cervical spine. 2. Extensive multilevel degenerative disc disease with chronic 3 mm anterolisthesis of C4 on C5.   Electronically Signed   By: Jeannine Boga M.D.   On: 01/07/2014 06:23     EKG Interpretation None      MDM   Final diagnoses:  Fall, initial encounter  Scalp laceration, initial encounter  Acute head injury, initial encounter  Dementia, with behavioral disturbance  Acute renal failure, unspecified acute renal failure type  Other constipation   Patient had baseline and had unwitnessed fall. Mild worsening creatinine, plan for CT scan head and neck, x-ray pelvis. Laceration does not require apparently they  controlled and small.  X-ray pelvis reviewed no acute dislocation or fracture. Point-of-care blood work showed mild worsening renal function. Patient is to followup outpatient for recheck by nursing home physician.  CT scans chronic changes no acute bleeding or fracture. X-ray showed known constipation. Prescription for MiraLAX outpatient followup.  Results and differential diagnosis were discussed with the patient/parent/guardian. Close follow up outpatient was discussed, comfortable with the plan.   Medications - No data to display  Filed Vitals:   01/07/14 0408  BP: 98/52  Temp: 97.6 F (36.4 C)  TempSrc: Oral  Resp: 20       Mariea Clonts, MD 01/07/14 0700

## 2014-01-08 LAB — URINE CULTURE: Colony Count: >=100000

## 2014-01-09 ENCOUNTER — Telehealth (HOSPITAL_BASED_OUTPATIENT_CLINIC_OR_DEPARTMENT_OTHER): Payer: Self-pay

## 2014-01-09 NOTE — Telephone Encounter (Signed)
Post ED Visit - Positive Culture Follow-up  Culture report reviewed by antimicrobial stewardship pharmacist: []  Wes [place], Pharm.D., BCPS [x]  Heide Guile, Pharm.D., BCPS []  Alycia Rossetti, Pharm.D., BCPS []  [place], Pharm.D., BCPS, AAHIVP []  Legrand Como, Pharm.D., BCPS, AAHIVP []    Positive URNC culture, >/= 100,000 colonies -> viridans streptococcus No treatment.  No further patient follow-up is required at this time.  Dortha Kern 01/09/2014, 9:12 PM

## 2014-01-16 ENCOUNTER — Encounter: Payer: Self-pay | Admitting: Adult Health

## 2014-01-16 ENCOUNTER — Non-Acute Institutional Stay (SKILLED_NURSING_FACILITY): Payer: PRIVATE HEALTH INSURANCE | Admitting: Adult Health

## 2014-01-16 DIAGNOSIS — G8929 Other chronic pain: Secondary | ICD-10-CM

## 2014-01-16 DIAGNOSIS — G309 Alzheimer's disease, unspecified: Secondary | ICD-10-CM

## 2014-01-16 DIAGNOSIS — K5900 Constipation, unspecified: Secondary | ICD-10-CM

## 2014-01-16 DIAGNOSIS — F329 Major depressive disorder, single episode, unspecified: Secondary | ICD-10-CM

## 2014-01-16 DIAGNOSIS — F419 Anxiety disorder, unspecified: Secondary | ICD-10-CM

## 2014-01-16 DIAGNOSIS — F0280 Dementia in other diseases classified elsewhere without behavioral disturbance: Secondary | ICD-10-CM

## 2014-01-16 DIAGNOSIS — J309 Allergic rhinitis, unspecified: Secondary | ICD-10-CM

## 2014-01-16 DIAGNOSIS — F3289 Other specified depressive episodes: Secondary | ICD-10-CM

## 2014-01-16 DIAGNOSIS — F411 Generalized anxiety disorder: Secondary | ICD-10-CM

## 2014-01-16 DIAGNOSIS — F32A Depression, unspecified: Secondary | ICD-10-CM

## 2014-01-16 DIAGNOSIS — K219 Gastro-esophageal reflux disease without esophagitis: Secondary | ICD-10-CM

## 2014-01-17 ENCOUNTER — Encounter: Payer: Self-pay | Admitting: Adult Health

## 2014-01-17 NOTE — Progress Notes (Signed)
Patient ID: [name], female   DOB: 09-03-1915, 80 y.o.   MRN: [id number]         PROGRESS NOTE  DATE: 01/16/14  FACILITY: [facility]  LEVEL OF CARE: SNF  Routine Visit  CHIEF COMPLAINT:  Manage GERD, constipation and dementia  HISTORY OF PRESENT ILLNESS:  REASSESSMENT OF ONGOING PROBLEM(S):  ANEMIA: The anemia has been stable. The patient denies fatigue, melena or hematochezia. No complications from the medications currently being used.7/15 hgb 9.0  ALLERGIC RHINITIS: Allergic rhinitis remains stable.  Patient denies ongoing symptoms such as runny nose sneezing or tearing. No complications reported from the current medication(s) being used.  DEPRESSION: The depression remains stable. Patient denies ongoing feelings of sadness, insomnia, anedhonia or lack of appetite. No complications reported from the medications currently being used. Staff do not report behavioral problems.  PAST MEDICAL HISTORY : Reviewed.  No changes.  CURRENT MEDICATIONS: Reviewed per [facility]  REVIEW OF SYSTEMS: Unobtainable due to dementia  PHYSICAL EXAMINATION  GENERAL: no acute distress, thin body habitus EYES: Normal sclerae, normal conjunctivae, no discharge NECK: supple, trachea midline, no neck masses, no thyroid tenderness, no thyromegaly RESPIRATORY: breathing is even & unlabored, BS CTAB CARDIAC: RRR, no murmur,no extra heart sounds, no edema GI: abdomen soft, normal BS, no masses, no tenderness, no hepatomegaly, no splenomegaly PSYCHIATRIC: the patient is alert & disoriented, affect & behavior appropriate  LABS/RADIOLOGY: 7-15 BUN 35, total protein 5.6, albumin 3.1 otherwise CMP normal, hemoglobin 9, MCV 97.4 otherwise CBC normal 8-14 BUN 32, creatinine 1.4 otherwise BMP normal 5/14 glucose 105, BUN 37, creatinine 1.17 otherwise BMP normal, CBC normal 1/14 BUN 42, creatinine 1.55 otherwise BMP normal 11/13 total protein 5.8 otherwise CMP normal, Hb 9.9, mcv 90 ow cbc  nl  ASSESSMENT/PLAN:  Alzheimer dementia - advanced; continue Namenda GERD - stable.; continue Prilosec Constipation - adequately controlled; continue Enulose, Miralax and Senokot-S Chronic pain - patient is comfortable; continue MS Contin Allergic rhinitis - well-controlled; continue Flonase Anemia of chronic disease - stable Depression - continue Remeron Anxiety - stable; continue Ativan.  CPT CODE: 70177  Seth Bake - NP [facility] [phone]

## 2014-02-27 ENCOUNTER — Other Ambulatory Visit: Payer: Self-pay | Admitting: *Deleted

## 2014-02-27 MED ORDER — AMBULATORY NON FORMULARY MEDICATION: Status: DC

## 2014-02-27 NOTE — Telephone Encounter (Signed)
[facility] 

## 2014-02-28 ENCOUNTER — Non-Acute Institutional Stay (SKILLED_NURSING_FACILITY): Payer: PRIVATE HEALTH INSURANCE | Admitting: Adult Health

## 2014-02-28 ENCOUNTER — Encounter: Payer: Self-pay | Admitting: Adult Health

## 2014-02-28 DIAGNOSIS — F419 Anxiety disorder, unspecified: Secondary | ICD-10-CM

## 2014-02-28 DIAGNOSIS — F411 Generalized anxiety disorder: Secondary | ICD-10-CM

## 2014-02-28 DIAGNOSIS — K219 Gastro-esophageal reflux disease without esophagitis: Secondary | ICD-10-CM

## 2014-02-28 DIAGNOSIS — J309 Allergic rhinitis, unspecified: Secondary | ICD-10-CM

## 2014-02-28 DIAGNOSIS — F32A Depression, unspecified: Secondary | ICD-10-CM

## 2014-02-28 DIAGNOSIS — F3289 Other specified depressive episodes: Secondary | ICD-10-CM

## 2014-02-28 DIAGNOSIS — G8929 Other chronic pain: Secondary | ICD-10-CM

## 2014-02-28 DIAGNOSIS — K5900 Constipation, unspecified: Secondary | ICD-10-CM

## 2014-02-28 DIAGNOSIS — G309 Alzheimer's disease, unspecified: Secondary | ICD-10-CM

## 2014-02-28 DIAGNOSIS — F0280 Dementia in other diseases classified elsewhere without behavioral disturbance: Secondary | ICD-10-CM

## 2014-02-28 DIAGNOSIS — F329 Major depressive disorder, single episode, unspecified: Secondary | ICD-10-CM

## 2014-02-28 NOTE — Progress Notes (Signed)
Patient ID: [name], female   DOB: 16-Jan-1916, 80 y.o.   MRN: [id number]         PROGRESS NOTE  DATE:     02/28/14  FACILITY: [facility]  LEVEL OF CARE: SNF  Routine Visit  CHIEF COMPLAINT:  Manage GERD, constipation and dementia  HISTORY OF PRESENT ILLNESS:  REASSESSMENT OF ONGOING PROBLEM(S):  DEMENTIA: The dementia remaines stable and continues to function adequately in the current living environment with supervision.  The patient has had little changes in behavior. No complications noted from the medications presently being used.  GERD: pt's GERD is stable.  Denies ongoing heartburn, abd. Pain, nausea or vomiting.  Currently on a PPI & tolerates it without any adverse reactions.  ANXIETY: The anxiety remains stable. Patient denies ongoing anxiety or irritability. No complications reported from the medications currently being used.  PAST MEDICAL HISTORY : Reviewed.  No changes.  CURRENT MEDICATIONS: Reviewed per [facility]  REVIEW OF SYSTEMS: Unobtainable due to dementia  PHYSICAL EXAMINATION  GENERAL: no acute distress, thin body habitus NECK: supple, trachea midline, no neck masses, no thyroid tenderness, no thyromegaly RESPIRATORY: breathing is even & unlabored, BS CTAB CARDIAC: RRR, no murmur,no extra heart sounds, no edema GI: abdomen soft, normal BS, no masses, no tenderness, no hepatomegaly, no splenomegaly Extremities:  Able to move all 4 extremities; SBA with transfers PSYCHIATRIC: the patient is alert & disoriented, affect & behavior appropriate  LABS/RADIOLOGY: 7-15 BUN 35, total protein 5.6, albumin 3.1 otherwise CMP normal, hemoglobin 9, MCV 97.4 otherwise CBC normal 8-14 BUN 32, creatinine 1.4 otherwise BMP normal 5/14 glucose 105, BUN 37, creatinine 1.17 otherwise BMP normal, CBC normal 1/14 BUN 42, creatinine 1.55 otherwise BMP normal 11/13 total protein 5.8 otherwise CMP normal, Hb 9.9, mcv 90 ow cbc nl  ASSESSMENT/PLAN:  Alzheimer dementia -  advanced; continue Namenda GERD - stable.; continue Prilosec Constipation - adequately controlled; continue Enulose, Miralax and Senokot-S Chronic pain - patient is comfortable; continue MS Contin Allergic rhinitis - well-controlled; continue Flonase Anemia of chronic disease - stable Depression - continue Remeron Anxiety - stable; continue Ativan.  CPT CODE: 03559  Seth Bake - NP [facility] [phone]

## 2014-03-20 ENCOUNTER — Other Ambulatory Visit: Payer: Self-pay | Admitting: *Deleted

## 2014-03-20 MED ORDER — MORPHINE SULFATE ER 30 MG PO TBCR: EXTENDED_RELEASE_TABLET | ORAL | Status: DC

## 2014-03-20 NOTE — Telephone Encounter (Signed)
[facility] 

## 2014-04-17 ENCOUNTER — Encounter: Payer: Self-pay | Admitting: Internal Medicine

## 2014-04-17 ENCOUNTER — Non-Acute Institutional Stay (SKILLED_NURSING_FACILITY): Payer: PRIVATE HEALTH INSURANCE | Admitting: Internal Medicine

## 2014-04-17 DIAGNOSIS — R634 Abnormal weight loss: Secondary | ICD-10-CM

## 2014-04-17 DIAGNOSIS — F0280 Dementia in other diseases classified elsewhere without behavioral disturbance: Secondary | ICD-10-CM

## 2014-04-17 DIAGNOSIS — F329 Major depressive disorder, single episode, unspecified: Secondary | ICD-10-CM

## 2014-04-17 DIAGNOSIS — F0393 Unspecified dementia, unspecified severity, with mood disturbance: Secondary | ICD-10-CM

## 2014-04-17 DIAGNOSIS — G894 Chronic pain syndrome: Secondary | ICD-10-CM

## 2014-04-17 DIAGNOSIS — K5901 Slow transit constipation: Secondary | ICD-10-CM

## 2014-04-17 DIAGNOSIS — F03918 Unspecified dementia, unspecified severity, with other behavioral disturbance: Secondary | ICD-10-CM

## 2014-04-17 DIAGNOSIS — F0391 Unspecified dementia with behavioral disturbance: Secondary | ICD-10-CM

## 2014-04-17 NOTE — Progress Notes (Signed)
Patient ID: [name], female   DOB: November 23, 1915, 80 y.o.   MRN: [id number]   Place of Service: [facility]  Allergies  Allergen Reactions  . Diona Fanti [Aspirin]     Code Status: DNR  Goals of Care: Comfort and Quality of Life/Long term care  Chief Complaint  Patient presents with  . Medical Management of Chronic Issues    AD, depression, chronic pain    HPI 80 y.o. female with PMH of AD with behavioral disturbance, chronic pain, depression, among others is being seen for a routine visit. No falls or skin issues reported. Weight has been declining steadily. No change in behaviors or functional status.  No additional concerns reported.   Wt Readings from Last 3 Encounters:  04/17/14 76 lb 9.6 oz (34.746 kg)  02/28/14 82 lb (37.195 kg)  01/16/14 78 lb (35.381 kg)    Review of Systems Unable to obtain due to dementia  Past Medical History  Diagnosis Date  . GERD (gastroesophageal reflux disease)   . Chronic kidney disease     Chronic Stage II  . Hypertension   . Osteoporosis   . Depression   . Myocardial infarction   . Cancer     Breast  . Substance abuse     Alcoholism   . Alzheimer's disease 07/15/2010  . COPD (chronic obstructive pulmonary disease)   . Diabetes mellitus without complication   . Anemia   . FTT (failure to thrive) in adult   . CAD (coronary artery disease)     Past Surgical History  Procedure Laterality Date  . Hernia repair      History   Social History  . Marital Status: Widowed    Spouse Name: N/A    Number of Children: N/A  . Years of Education: N/A   Occupational History  . Not on file.   Social History Main Topics  . Smoking status: Current Every Day Smoker  . Smokeless tobacco: Not on file  . Alcohol Use: Not on file  . Drug Use: Not on file  . Sexual Activity: Not on file   Other Topics Concern  . Not on file   Social History Narrative      Medication List       This list is accurate as of: 04/17/14   5:20 PM.  Always use your most recent med list.               AKWA TEARS OP  Apply 2 drops to eye 3 (three) times daily. Instill  2 drops into each eye three times daily as needed for dryness/irritation to eyes. *Wait 5 minutes between 2 eye meds*     AMBULATORY NON FORMULARY MEDICATION  - Lorazepam 0.381m/ml Gel  - Sig: Apply 145mtopically daily; Administer 81m45mopically times 1 as needed between 10pm and 7am only     DECUBI-VITE Caps  Take 1 capsule by mouth daily. For wound healing     ENULOSE 10 GM/15ML Soln  Generic drug:  lactulose (encephalopathy)  Take 30 g by mouth daily. For constipation     fluticasone 50 MCG/ACT nasal spray  Commonly known as:  FLONASE  Place 2 sprays into both nostrils daily.     memantine 10 MG tablet  Commonly known as:  NAMENDA  Take 10 mg by mouth 2 (two) times daily. For dementia     mirtazapine 15 MG tablet  Commonly known as:  REMERON  Take 15 mg by mouth at bedtime.  For depression     morphine 30 MG 12 hr tablet  Commonly known as:  MS CONTIN  Take one tablet by mouth twice daily. Do not crush     omeprazole 20 MG capsule  Commonly known as:  PRILOSEC  Take 20 mg by mouth at bedtime. For GERD     OXYGEN  Inhale 2 L into the lungs as needed. For SOB and SAT >88%     polyethylene glycol packet  Commonly known as:  MIRALAX / GLYCOLAX  Take 17 g by mouth daily.     SENNA-S PO  Take 2 tablets by mouth at bedtime.     UNABLE TO FIND  Lorazepam 0.444m/ml gel apply BID, and 0.556mPRN between 10pm and 7am only, and 44m54m8H PRN        Physical Exam Filed Vitals:   04/17/14 1555  BP: 102/60  Pulse: 75  Temp: 96 F (35.6 C)  Resp: 19  Body mass index is 13.14 kg/(m^2).  Constitutional: thin, frail elderly female in no acute distress.  HEENT: Normocephalic and atraumatic. PERRL. EOM intact. No icterus. Oral mucosa moist.  Neck: Supple and nontender. No lymphadenopathy, masses, or thyromegaly. No JVD or carotid  bruits. Cardiac: Normal S1, S2. RRR without appreciable murmurs, rubs, or gallops. Intact pulses intact. No dependent edema.  Lungs: No respiratory distress. Breath sounds clear bilaterally without rales, rhonchi, or wheezes. Abdomen: Audible bowel sounds in all quadrants. Soft, nontender, nondistended.  Musculoskeletal: Able to move all extremities. On wheelchair Skin: Warm and dry.  Neurological: Alert  Psychiatric: a little restless but no agitation.   Labs Reviewed CBC Latest Ref Rng 01/07/2014 12/14/2013 07/15/2010  WBC - - 4.5 3.0(L)  Hemoglobin 12.0 - 15.0 g/dL 11.6(L) 9.0(A) 9.1(L)  Hematocrit 36.0 - 46.0 % 34.0(L) 30(A) 28.7(L)  Platelets 150 - 399 K/L - 218 196    CMP     Component Value Date/Time   NA 134* 01/07/2014 0528   NA 137 12/14/2013   K 4.2 01/07/2014 0528   CL 99 01/07/2014 0528   CO2 27 07/15/2010 0423   GLUCOSE 84 01/07/2014 0528   BUN 54* 01/07/2014 0528   BUN 35* 12/14/2013   CREATININE 1.80* 01/07/2014 0528   CALCIUM 8.5 07/15/2010 0423   PROT 6.8 07/13/2010 2030   ALBUMIN 3.5 07/13/2010 2030   AST 15 12/14/2013   ALT 6* 12/14/2013   ALKPHOS 27* 07/13/2010 2030   BILITOT 0.4 07/13/2010 2030   GFRNONAA 46* 07/15/2010 0423   GFRAA * 07/15/2010 0423    56        The eGFR has been calculated using the MDRD equation. This calculation has not been validated in all clinical situations. eGFR's persistently <60 mL/min signify possible Chronic Kidney Disease.    Assessment & Plan 1. Dementia with behavioral disturbance Advanced with progressive decline anticipated. Continue Namenda 41m57mice daily. Continue ativan 0.5mg 68mically daily, 44mg t43mcally Q8H PRN, and 0.5mg to38mally as needed between 10pm and 7pm only. Encourage therapeutic milieu. Continue fall and pressure ulcer precautions. Continue to monitor for change in behavior.   2. Slow transit constipation Stable. Continue latulose 20mg da64m miralax daily, and senakot daily. Continue to  monitor  3. Chronic pain syndrome Stable. Continue morphine 30mg ER 58me daily. Continue to monitor.   4. Depression due to dementia Stable. Continue Remeron 15mg dail76m bedtime and monitor for change in behaviors  5. Loss of weight. Gradual. Most probable secondary to advanced dementia. Continue remeron to stimulate  appetite. Continue medpass 120m four times daily. Continue assist with feeding and liberalization of diet. Continue to monitor.    Family/Staff Communication Plan of care discuss with professional staff members. Professional staff members verbalize understanding and agree with plan of care. No additional questions or concerns reported.    KArthur Holms MSN, AGNP-C PTilgh[place] IslandGKalona Morrill [phone][8am-5pm] After hours: [phone]  I have personally reviewed this note and agree with the care plan  [facility] MD  [facility]Adult [facility] [phone](Monday-Friday 8 am - 5 pm) [phone](afterhours)

## 2014-07-15 ENCOUNTER — Non-Acute Institutional Stay (SKILLED_NURSING_FACILITY): Payer: PRIVATE HEALTH INSURANCE | Admitting: Adult Health

## 2014-07-15 ENCOUNTER — Encounter: Payer: Self-pay | Admitting: Adult Health

## 2014-07-15 DIAGNOSIS — F0280 Dementia in other diseases classified elsewhere without behavioral disturbance: Secondary | ICD-10-CM

## 2014-07-15 DIAGNOSIS — F329 Major depressive disorder, single episode, unspecified: Secondary | ICD-10-CM

## 2014-07-15 DIAGNOSIS — G894 Chronic pain syndrome: Secondary | ICD-10-CM

## 2014-07-15 DIAGNOSIS — F419 Anxiety disorder, unspecified: Secondary | ICD-10-CM

## 2014-07-15 DIAGNOSIS — F0391 Unspecified dementia with behavioral disturbance: Secondary | ICD-10-CM

## 2014-07-15 DIAGNOSIS — J309 Allergic rhinitis, unspecified: Secondary | ICD-10-CM

## 2014-07-15 DIAGNOSIS — F03918 Unspecified dementia, unspecified severity, with other behavioral disturbance: Secondary | ICD-10-CM

## 2014-07-15 DIAGNOSIS — K5900 Constipation, unspecified: Secondary | ICD-10-CM

## 2014-07-15 DIAGNOSIS — F0393 Unspecified dementia, unspecified severity, with mood disturbance: Secondary | ICD-10-CM

## 2014-07-15 DIAGNOSIS — K219 Gastro-esophageal reflux disease without esophagitis: Secondary | ICD-10-CM

## 2014-07-15 NOTE — Progress Notes (Signed)
Patient ID: [name], female   DOB: 06-25-1915, 81 y.o.   MRN: [id number]   07/15/2014  Facility:  Nursing Home Location:  [place] Room Number: [phone] LEVEL OF CARE:  SNF (31)  Routine Visit  Chief Complaint  Patient presents with  . Medical Management of Chronic Issues    Anxiety, constipation, allergic rhinitis, dementia, chronic pain, depression and GERD    HISTORY OF PRESENT ILLNESS:  This is a 81 year old female long-term resident at [facility]. She is being seen for a routine visit. Her weight is stable - 82.6 lbs. She has PMH of Anxiety, allergic rhinitis, depression and dementia. She has been stable for the past month.  PAST MEDICAL HISTORY:  Past Medical History  Diagnosis Date  . GERD (gastroesophageal reflux disease)   . Chronic kidney disease     Chronic Stage II  . Hypertension   . Osteoporosis   . Depression   . Myocardial infarction   . Cancer     Breast  . Substance abuse     Alcoholism   . Alzheimer's disease 07/15/2010  . COPD (chronic obstructive pulmonary disease)   . Diabetes mellitus without complication   . Anemia   . FTT (failure to thrive) in adult   . CAD (coronary artery disease)     CURRENT MEDICATIONS: Reviewed per MAR/see medication list  Allergies  Allergen Reactions  . Asa [Aspirin]      REVIEW OF SYSTEMS:  Unobtainable, patient not a good historian  PHYSICAL EXAMINATION  GENERAL: no acute distress EYES: conjunctivae normal, sclerae normal, normal eye lids NECK: supple, trachea midline, no neck masses, no thyroid tenderness, no thyromegaly LYMPHATICS: no LAN in the neck, no supraclavicular LAN RESPIRATORY: breathing is even & unlabored, BS CTAB CARDIAC: RRR, no murmur,no extra heart sounds, no edema GI: abdomen soft, normal BS, no masses, no tenderness, no hepatomegaly, no splenomegaly EXTREMITIES: Able to move 4 extremities; uses wheelchair for ambulation PSYCHIATRIC: the patient  is alert & oriented to person, affect & behavior appropriate  LABS/RADIOLOGY: 06/15/14  sodium 142 potassium 4.0 glucose 95 BUN 47 creatinine 1.1 calcium 9.1 total protein 5.7 albumin 3.2 ALP 24 AST 17 ALT 9 GFR 46.96 WBC 4.3 hemoglobin 10.2 hematocrit 33.1 MCV 97.1 Labs reviewed: Basic Metabolic Panel:  Recent Labs  12/14/13 01/07/14 0528  NA 137 134*  K 4.3 4.2  CL  --  99  GLUCOSE  --  84  BUN 35* 54*  CREATININE  --  1.80*   Liver Function Tests:  Recent Labs  12/14/13  AST 15  ALT 6*   CBC:  Recent Labs  12/14/13 01/07/14 0528  WBC 4.5  --   HGB 9.0* 11.6*  HCT 30* 34.0*  PLT 218  --     ASSESSMENT/PLAN:  Anxiety - mood is stable; continue Ativan 0.5 mg/mL gel transdermally daily and twice a day when necessary Constipation - regular; continue Senokot S2 tabs by mouth daily at bedtime, MiraLAX 17 g +4-6 ounces liquid by mouth daily and Enulose 20 g/30 mL by mouth every morning Allergic rhinitis - stable; continue Flonase 50 g/ACT 2 sprays in each nostril of daily Dementia - continue memantine 10 mg 1 tab by mouth twice a day Chronic pain - some complaints of pain; continue MS Contin 30 mg ER 1 tab by mouth twice a day Depression - continue Remeron 15 mg 1 tab by mouth daily at bedtime GERD - continue Prilosec 20 mg 1 capsule by  mouth daily at bedtime   Goals of care:   long-term care    Labs/test ordered:  none    [facility], [place]

## 2014-09-01 LAB — CBC AND DIFFERENTIAL
HCT: 30 % — AB (ref 36–46)
HEMOGLOBIN: 9.8 g/dL — AB (ref 12.0–16.0)
Platelets: 223 10*3/uL (ref 150–399)
WBC: 4.1 10*3/mL

## 2014-09-01 LAB — HEPATIC FUNCTION PANEL
ALK PHOS: 35 U/L (ref 25–125)
ALT: 58 U/L — AB (ref 7–35)
AST: 22 U/L (ref 13–35)
Bilirubin, Total: 0.3 mg/dL

## 2014-09-01 LAB — BASIC METABOLIC PANEL
BUN: 42 mg/dL — AB (ref 4–21)
Creatinine: 1.2 mg/dL — AB (ref <=1.1)
GLUCOSE: 128 mg/dL
Potassium: 4.1 mmol/L (ref 3.4–5.3)
Sodium: 140 mmol/L (ref 137–147)

## 2014-09-10 ENCOUNTER — Emergency Department (HOSPITAL_COMMUNITY): Payer: Medicare Other

## 2014-09-10 ENCOUNTER — Encounter (HOSPITAL_COMMUNITY): Payer: Self-pay | Admitting: *Deleted

## 2014-09-10 ENCOUNTER — Observation Stay (HOSPITAL_COMMUNITY)
Admission: EM | Admit: 2014-09-10 | Discharge: 2014-09-11 | Disposition: A | Discharge status: Skilled Nursing Facility | Payer: Medicare Other | Attending: Internal Medicine | Admitting: Internal Medicine

## 2014-09-10 DIAGNOSIS — Z7951 Long term (current) use of inhaled steroids: Secondary | ICD-10-CM | POA: Diagnosis not present

## 2014-09-10 DIAGNOSIS — Z66 Do not resuscitate: Secondary | ICD-10-CM | POA: Diagnosis not present

## 2014-09-10 DIAGNOSIS — Z853 Personal history of malignant neoplasm of breast: Secondary | ICD-10-CM | POA: Insufficient documentation

## 2014-09-10 DIAGNOSIS — Z886 Allergy status to analgesic agent status: Secondary | ICD-10-CM | POA: Diagnosis not present

## 2014-09-10 DIAGNOSIS — F419 Anxiety disorder, unspecified: Secondary | ICD-10-CM | POA: Insufficient documentation

## 2014-09-10 DIAGNOSIS — E872 Acidosis, unspecified: Secondary | ICD-10-CM | POA: Diagnosis present

## 2014-09-10 DIAGNOSIS — T68XXXA Hypothermia, initial encounter: Secondary | ICD-10-CM | POA: Diagnosis not present

## 2014-09-10 DIAGNOSIS — F17210 Nicotine dependence, cigarettes, uncomplicated: Secondary | ICD-10-CM | POA: Diagnosis not present

## 2014-09-10 DIAGNOSIS — F329 Major depressive disorder, single episode, unspecified: Secondary | ICD-10-CM | POA: Diagnosis not present

## 2014-09-10 DIAGNOSIS — E876 Hypokalemia: Secondary | ICD-10-CM | POA: Diagnosis not present

## 2014-09-10 DIAGNOSIS — K922 Gastrointestinal hemorrhage, unspecified: Secondary | ICD-10-CM | POA: Diagnosis not present

## 2014-09-10 DIAGNOSIS — K920 Hematemesis: Secondary | ICD-10-CM | POA: Diagnosis not present

## 2014-09-10 DIAGNOSIS — I2510 Atherosclerotic heart disease of native coronary artery without angina pectoris: Secondary | ICD-10-CM | POA: Diagnosis not present

## 2014-09-10 DIAGNOSIS — I160 Hypertensive urgency: Secondary | ICD-10-CM | POA: Diagnosis present

## 2014-09-10 DIAGNOSIS — I252 Old myocardial infarction: Secondary | ICD-10-CM | POA: Insufficient documentation

## 2014-09-10 DIAGNOSIS — F0280 Dementia in other diseases classified elsewhere without behavioral disturbance: Secondary | ICD-10-CM | POA: Diagnosis not present

## 2014-09-10 DIAGNOSIS — N289 Disorder of kidney and ureter, unspecified: Secondary | ICD-10-CM

## 2014-09-10 DIAGNOSIS — K219 Gastro-esophageal reflux disease without esophagitis: Secondary | ICD-10-CM | POA: Insufficient documentation

## 2014-09-10 DIAGNOSIS — G309 Alzheimer's disease, unspecified: Secondary | ICD-10-CM | POA: Diagnosis not present

## 2014-09-10 DIAGNOSIS — K5641 Fecal impaction: Secondary | ICD-10-CM | POA: Diagnosis not present

## 2014-09-10 DIAGNOSIS — E119 Type 2 diabetes mellitus without complications: Secondary | ICD-10-CM | POA: Insufficient documentation

## 2014-09-10 DIAGNOSIS — J449 Chronic obstructive pulmonary disease, unspecified: Secondary | ICD-10-CM | POA: Diagnosis not present

## 2014-09-10 DIAGNOSIS — G8929 Other chronic pain: Secondary | ICD-10-CM | POA: Diagnosis not present

## 2014-09-10 DIAGNOSIS — E870 Hyperosmolality and hypernatremia: Secondary | ICD-10-CM

## 2014-09-10 DIAGNOSIS — I129 Hypertensive chronic kidney disease with stage 1 through stage 4 chronic kidney disease, or unspecified chronic kidney disease: Secondary | ICD-10-CM | POA: Insufficient documentation

## 2014-09-10 DIAGNOSIS — N182 Chronic kidney disease, stage 2 (mild): Secondary | ICD-10-CM | POA: Insufficient documentation

## 2014-09-10 DIAGNOSIS — M810 Age-related osteoporosis without current pathological fracture: Secondary | ICD-10-CM | POA: Insufficient documentation

## 2014-09-10 LAB — CBC WITH DIFFERENTIAL/PLATELET
Basophils Absolute: 0 10*3/uL (ref 0.0–0.1)
Basophils Relative: 0 % (ref 0–1)
EOS PCT: 0 % (ref 0–5)
Eosinophils Absolute: 0 10*3/uL (ref 0.0–0.7)
HEMATOCRIT: 38 % (ref 36.0–46.0)
Hemoglobin: 12.1 g/dL (ref 12.0–15.0)
Lymphocytes Relative: 8 % — ABNORMAL LOW (ref 12–46)
Lymphs Abs: 1.1 10*3/uL (ref 0.7–4.0)
MCH: 30 pg (ref 26.0–34.0)
MCHC: 31.8 g/dL (ref 30.0–36.0)
MCV: 94.3 fL (ref 78.0–100.0)
MONO ABS: 0.8 10*3/uL (ref 0.1–1.0)
Monocytes Relative: 6 % (ref 3–12)
Neutro Abs: 12.4 10*3/uL — ABNORMAL HIGH (ref 1.7–7.7)
Neutrophils Relative %: 86 % — ABNORMAL HIGH (ref 43–77)
Platelets: 413 10*3/uL — ABNORMAL HIGH (ref 150–400)
RBC: 4.03 MIL/uL (ref 3.87–5.11)
RDW: 14 % (ref 11.5–15.5)
WBC: 14.4 10*3/uL — AB (ref 4.0–10.5)

## 2014-09-10 LAB — SAMPLE TO BLOOD BANK

## 2014-09-10 LAB — PROTIME-INR
INR: 1.03 (ref 0.00–1.49)
Prothrombin Time: 13.6 seconds (ref 11.6–15.2)

## 2014-09-10 LAB — COMPREHENSIVE METABOLIC PANEL
ALBUMIN: 3.4 g/dL — AB (ref 3.5–5.2)
ALT: 19 U/L (ref 0–35)
AST: 34 U/L (ref 0–37)
Alkaline Phosphatase: 35 U/L — ABNORMAL LOW (ref 39–117)
Anion gap: 12 (ref 5–15)
BUN: 35 mg/dL — ABNORMAL HIGH (ref 6–23)
CALCIUM: 9.9 mg/dL (ref 8.4–10.5)
CO2: 33 mmol/L — AB (ref 19–32)
Chloride: 103 mmol/L (ref 96–112)
Creatinine, Ser: 1.13 mg/dL — ABNORMAL HIGH (ref 0.50–1.10)
GFR calc Af Amer: 46 mL/min — ABNORMAL LOW (ref >90)
GFR calc non Af Amer: 40 mL/min — ABNORMAL LOW (ref >90)
GLUCOSE: 211 mg/dL — AB (ref 70–99)
POTASSIUM: 3.2 mmol/L — AB (ref 3.5–5.1)
SODIUM: 148 mmol/L — AB (ref 135–145)
TOTAL PROTEIN: 6.9 g/dL (ref 6.0–8.3)
Total Bilirubin: 0.5 mg/dL (ref 0.3–1.2)

## 2014-09-10 LAB — APTT: aPTT: 30 seconds (ref 24–37)

## 2014-09-10 LAB — I-STAT CG4 LACTIC ACID, ED
LACTIC ACID, VENOUS: 5.94 mmol/L — AB (ref 0.5–2.0)
Lactic Acid, Venous: 3.68 mmol/L (ref 0.5–2.0)

## 2014-09-10 LAB — LIPASE, BLOOD: LIPASE: 41 U/L (ref 11–59)

## 2014-09-10 LAB — OCCULT BLOOD GASTRIC / DUODENUM (SPECIMEN CUP): Occult Blood, Gastric: POSITIVE — AB

## 2014-09-10 MED ORDER — ONDANSETRON HCL 4 MG PO TABS: 4.0000 mg | ORAL_TABLET | Freq: Four times a day (QID) | ORAL | Status: DC | PRN

## 2014-09-10 MED ORDER — PANTOPRAZOLE SODIUM 40 MG IV SOLR
40.0000 mg | Freq: Once | INTRAVENOUS | Status: AC
  Administered 2014-09-10: 40 mg via INTRAVENOUS
  Filled 2014-09-10: qty 40

## 2014-09-10 MED ORDER — SODIUM CHLORIDE 0.9 % IV SOLN
INTRAVENOUS | Status: AC
  Administered 2014-09-10: 15:00:00 via INTRAVENOUS

## 2014-09-10 MED ORDER — FLEET ENEMA 7-19 GM/118ML RE ENEM
1.0000 | ENEMA | Freq: Once | RECTAL | Status: AC
  Administered 2014-09-10: 1 via RECTAL
  Filled 2014-09-10: qty 1

## 2014-09-10 MED ORDER — SODIUM CHLORIDE 0.9 % IJ SOLN
3.0000 mL | Freq: Two times a day (BID) | INTRAMUSCULAR | Status: DC
  Administered 2014-09-10 – 2014-09-11 (×2): 3 mL via INTRAVENOUS

## 2014-09-10 MED ORDER — SODIUM CHLORIDE 0.9 % IV SOLN
Freq: Once | INTRAVENOUS | Status: AC
  Administered 2014-09-10: 07:00:00 via INTRAVENOUS

## 2014-09-10 MED ORDER — PANTOPRAZOLE SODIUM 40 MG IV SOLR: 40.0000 mg | Freq: Two times a day (BID) | INTRAVENOUS | Status: DC

## 2014-09-10 MED ORDER — MORPHINE SULFATE 2 MG/ML IJ SOLN: 1.0000 mg | INTRAMUSCULAR | Status: DC | PRN

## 2014-09-10 MED ORDER — LORAZEPAM 2 MG/ML IJ SOLN: 1.0000 mg | INTRAMUSCULAR | Status: DC | PRN

## 2014-09-10 MED ORDER — SODIUM CHLORIDE 0.9 % IV SOLN
8.0000 mg/h | INTRAVENOUS | Status: DC
  Administered 2014-09-10: 8 mg/h via INTRAVENOUS
  Filled 2014-09-10 (×5): qty 80

## 2014-09-10 MED ORDER — BOOST / RESOURCE BREEZE PO LIQD
1.0000 | Freq: Three times a day (TID) | ORAL | Status: DC
  Administered 2014-09-09 – 2014-09-11 (×2): 1 via ORAL

## 2014-09-10 MED ORDER — ONDANSETRON HCL 4 MG/2ML IJ SOLN: 4.0000 mg | Freq: Four times a day (QID) | INTRAMUSCULAR | Status: DC | PRN

## 2014-09-10 MED ORDER — ACETAMINOPHEN 325 MG PO TABS: 650.0000 mg | ORAL_TABLET | Freq: Four times a day (QID) | ORAL | Status: DC | PRN

## 2014-09-10 MED ORDER — LACTULOSE 10 GM/15ML PO SOLN
30.0000 g | Freq: Every day | ORAL | Status: DC
  Administered 2014-09-10 – 2014-09-11 (×2): 30 g via ORAL
  Filled 2014-09-10 (×3): qty 45

## 2014-09-10 MED ORDER — ACETAMINOPHEN 650 MG RE SUPP: 650.0000 mg | Freq: Four times a day (QID) | RECTAL | Status: DC | PRN

## 2014-09-10 MED ORDER — PHENOL 1.4 % MT LIQD: 1.0000 | OROMUCOSAL | Status: DC | PRN

## 2014-09-10 NOTE — H&P (Signed)
[facility] History and Physical                                                                                    [name], is a 81 y.o. female  MRN: [id number]   DOB - 12-Sep-1915  Admit Date - 09/10/2014  Outpatient Primary MD for the patient is No primary care provider on file.  With History of -  Past Medical History  Diagnosis Date  . GERD (gastroesophageal reflux disease)   . Chronic kidney disease     Chronic Stage II  . Hypertension   . Osteoporosis   . Depression   . Myocardial infarction   . Cancer     Breast  . Substance abuse     Alcoholism   . Alzheimer's disease 07/15/2010  . COPD (chronic obstructive pulmonary disease)   . Diabetes mellitus without complication   . Anemia   . FTT (failure to thrive) in adult   . CAD (coronary artery disease)       Past Surgical History  Procedure Laterality Date  . Hernia repair      in for   Chief Complaint  Patient presents with  . Hematemesis     HPI  [name]  is a 81 y.o. female, with a pmh of severe alzheimer's dementia, barretts esophagus, intrathoracic stomach, anxiety, depression, failure to thrive, DM, CAD, COPD, and BRCA who was brought to the ER from [facility] for vomiting blood.  The patient is unable to give any history.  She is cachectically thin, frail, and remains in the fetal position spitting about 20 cc of brown liquid from her mouth occasionally.  In the ER she is found to be hypothermic with a temp of 96.5, acidotic with a lactic acid of 5.9, and hypertensive at 179/123.  She has a fecal impaction on xray. I have discussed her situation with both her son Orrin Brigham) and her daughter Governor Rooks).  Her son states that she has been a DNR for a number of years and he has told [facility] and multiple medical professionals taking care of her not to let her suffer.  I discussed instituting comfort measures only with him and he was in agreement with doing so.  I expressed to  both Mr. Orbie Pyo and Ms. Dunn that their mother may not survive this hospitalization.    Review of Systems   Patient is unable to give.  Social History History  Substance Use Topics  . Smoking status: Current Every Day Smoker  . Smokeless tobacco: Not on file  . Alcohol Use: Not on file   Patient is unable to give.   Family History Patient is unable to give.  Prior to Admission medications   Medication Sig Start Date End Date Taking? Authorizing Provider  AMBULATORY NON FORMULARY MEDICATION Lorazepam 0.316m/ml Gel Sig: Apply 134mtopically daily; Administer 16m19mopically times 1 as needed between 10pm and 7am only 02/27/14  Yes Tiffany L Reed, DO  fluticasone (FLONASE) 50 MCG/ACT nasal spray Place 2 sprays into both nostrils daily.   Yes Historical Provider, MD  lactulose, encephalopathy, (ENULOSE) 10 GM/15ML SOLN Take 30 g by mouth  daily. For constipation   Yes Historical Provider, MD  memantine (NAMENDA) 10 MG tablet Take 10 mg by mouth 2 (two) times daily. For dementia   Yes Historical Provider, MD  mirtazapine (REMERON) 15 MG tablet Take 15 mg by mouth at bedtime. For depression   Yes Historical Provider, MD  omeprazole (PRILOSEC) 20 MG capsule Take 20 mg by mouth at bedtime. For GERD   Yes Historical Provider, MD  OXYGEN-HELIUM IN Inhale 2 L into the lungs as needed. For SOB and SAT >88%   Yes Historical Provider, MD  polyethylene glycol (MIRALAX / GLYCOLAX) packet Take 17 g by mouth daily. 01/07/14  Yes Elnora Morrison, MD  Protein (PROCEL) POWD Take 1 scoop by mouth 2 (two) times daily.   Yes Historical Provider, MD  Sennosides-Docusate Sodium (SENNA-S PO) Take 2 tablets by mouth at bedtime.   Yes Historical Provider, MD  Artificial Tear Ointment (AKWA TEARS OP) Apply 2 drops to eye 3 (three) times daily. Instill  2 drops into each eye three times daily as needed for dryness/irritation to eyes. *Wait 5 minutes between 2 eye meds*    Historical Provider, MD  Multiple Vitamins-Minerals  (DECUBI-VITE) CAPS Take 1 capsule by mouth daily. For wound healing    Historical Provider, MD  UNABLE TO FIND Lorazepam 0.38m/ml gel apply transdermally BID PRN, and 0.520mml gel transdermally Q D    Historical Provider, MD    Allergies  Allergen Reactions  . Asa [Aspirin]     Physical Exam  Vitals  Filed Vitals:   09/10/14 0730 09/10/14 0800 09/10/14 0815 09/10/14 0830  BP: 151/110 187/95 179/123 145/87  Pulse: 97 95 102 100  Temp:      TempSrc:      Resp: 15 14 22 18   SpO2: 96% 92% 95% 96%     General:  Cachetic, frail, female, lying in the fetal position, unwilling or unable to relax in order to be examined.   Notes from SNF indicate she weighs 82 lbs.  Psych:  Severely demented.  Awake.  Does not speak to me, but she does moan when I attempt to move her arm or palpate her.  ENT:  Ears and Eyes appear Normal, Conjunctivae clear, PER. Brown liquid being spewed from her mouth.  Neck:  Supple, No lymphadenopathy appreciated  Respiratory:  Symmetrical chest wall movement, Good air movement bilaterally, CTAB.  Cardiac:  tachycardic, No frank Murmurs, no LE edema noted, no JVD.    Abdomen:  Positive bowel sounds, Soft, Non tender, Non distended, difficult to examine as patient is unable to unfold from the fetal position.  Contracted?  Skin:  Dry, poor skin turgor, thin.   Data Review  CBC  Recent Labs Lab 09/10/14 0450  WBC 14.4*  HGB 12.1  HCT 38.0  PLT 413*  MCV 94.3  MCH 30.0  MCHC 31.8  RDW 14.0  LYMPHSABS 1.1  MONOABS 0.8  EOSABS 0.0  BASOSABS 0.0    Chemistries   Recent Labs Lab 09/10/14 0450  NA 148*  K 3.2*  CL 103  CO2 33*  GLUCOSE 211*  BUN 35*  CREATININE 1.13*  CALCIUM 9.9  AST 34  ALT 19  ALKPHOS 35*  BILITOT 0.5    Results for MIJEYDI, KLINGELMRN [id number] of 09/10/2014 08:37  Ref. Range 09/10/2014 08:18  Lactic Acid, Venous Latest Range: 0.5-2.0 mmol/L 5.94 (HH)     Coagulation profile  Recent Labs Lab  09/10/14 0450  INR 1.03    Imaging results:  Dg Chest Portable 1 View  09/10/2014   CLINICAL DATA:  Hematemesis.  EXAM: PORTABLE CHEST - 1 VIEW  COMPARISON:  03/19/2009  FINDINGS: Patient is rotated to the right. Cardiomegaly is unchanged. Dense atherosclerosis of the thoracic aorta. Lungs remain hyperinflated but clear. There is no consolidation, pleural effusion, or pneumothorax. The patient's chin obscures the left lung apex.  IMPRESSION: Stable cardiomegaly.  No acute pulmonary process.   Electronically Signed   By: Jeb Levering M.D.   On: 09/10/2014 06:22   Dg Abd Portable 2v  09/10/2014   CLINICAL DATA:  81 year old female with hematemesis.  EXAM: PORTABLE ABDOMEN - 2 VIEW  COMPARISON:  CT 07/13/2010  FINDINGS: Findings consistent with fecal impaction with stool ball distending the rectum. No small bowel dilatation. No gross free intra-abdominal air, however evaluation is limited by technique. No radiopaque calculi are seen. Left hip prosthesis in place.  IMPRESSION: Findings consistent with fecal impaction. No bowel obstruction or free air.   Electronically Signed   By: Jeb Levering M.D.   On: 09/10/2014 06:21    Assessment & Plan  Principal Problem:   Upper GI bleed Active Problems:   Hypothermia   Hematemesis   Lactic acidosis   Hypertensive urgency   Fecal impaction   Alzheimer's disease   Chronic pain   Upper GI Bleed. Patient has a long history of Barretts Esophagus and large sliding hiatal hernia causing an intrathoracic stomach. The EDP contacted Dr. Collene Mares (GI) who felt it would be unsafe to perform any procedures on Mrs. Alroy Dust. She has received a bolus of protonix and will be placed on IV PPI BID. After discussions with her two children - she has been made comfort care.  Comfort measures only. We will not continue to check labs at this point we will simply treat her supportively with PPI, Zofran, short term IVF, and comfort feedings if she can tolerate them.   (Her son states she likes ice cream).  Will order morphine PRN pain, and ativan IV PRN as well. Admit to med surg.  If she stabilizes I anticipate she will return to [facility].  She is on Hospice.  I have notified [facility] of her admission.  I have requested that Palliative Medicine/Social work consider Residential H[facility]ice.  Lactic Acidosis (5.9). She has received a bolus in the ER and her lactic acid has continued to rise. I will order gentle IVF for 12 hours, as a comfort measure.  No further labs ordered.  Hypothermia. Comfort measures.  Hypertensive Urgency.   Comfort measures.  I have asked the vitals be checked only once daily.  Fecal impaction. I attempted disimpaction without much success in the ER (Soft light brown stool). Fleet enema ordered.  Severe Alzheimer's Dementia. Holding oral medications.  Comfort measures only.  DVT Prophylaxis: none.  GI Bleed.  AM Labs Ordered, also please review Full Orders  Family Communication:   Spoke with both Orrin Brigham and [employer].  Code Status:  DNR.  CMO  Condition:  Poor.   Time spent in minutes : [address]  PA-C on 09/10/2014 at 8:37 AM  Between 7am to 7pm - Pager - [phone]  After 7pm go to [email or web address] - password [email or web address]  And look for the night coverage person covering me after hours  [facility]

## 2014-09-10 NOTE — ED Provider Notes (Signed)
CSN: [id number]     Arrival date & time 09/10/14  0424 History   First MD Initiated Contact with Patient 09/10/14 0431     Chief Complaint  Patient presents with  . Hematemesis   Level V caveat for dementia  (Consider location/radiation/quality/duration/timing/severity/associated sxs/prior Treatment) HPI   Patient presents via EMS from her nursing facility. They told EMS patient started vomiting large amounts of blood this morning. Patient denies any pain to me but states she does have nausea. She did tell her nurse that she had a sore throat.  PCP Dr Audree Bane  Patient is DO NOT RESUSCITATE  Past Medical History  Diagnosis Date  . GERD (gastroesophageal reflux disease)   . Chronic kidney disease     Chronic Stage II  . Hypertension   . Osteoporosis   . Depression   . Myocardial infarction   . Cancer     Breast  . Substance abuse     Alcoholism   . Alzheimer's disease 07/15/2010  . COPD (chronic obstructive pulmonary disease)   . Diabetes mellitus without complication   . Anemia   . FTT (failure to thrive) in adult   . CAD (coronary artery disease)    Past Surgical History  Procedure Laterality Date  . Hernia repair     No family history on file. History  Substance Use Topics  . Smoking status: Current Every Day Smoker  . Smokeless tobacco: Not on file  . Alcohol Use: Not on file   Patient lives in a nursing facility  OB History    No data available     Review of Systems  All other systems reviewed and are negative.     Allergies  Asa  Home Medications   Prior to Admission medications   Medication Sig Start Date End Date Taking? Authorizing Provider  AMBULATORY NON FORMULARY MEDICATION Lorazepam 0.5mg /ml Gel Sig: Apply 76ml topically daily; Administer 84ml topically times 1 as needed between 10pm and 7am only 02/27/14   Tiffany L Reed, DO  Artificial Tear Ointment (AKWA TEARS OP) Apply 2 drops to eye 3 (three) times daily. Instill  2 drops into each  eye three times daily as needed for dryness/irritation to eyes. *Wait 5 minutes between 2 eye meds*    Historical Provider, MD  fluticasone (FLONASE) 50 MCG/ACT nasal spray Place 2 sprays into both nostrils daily.    Historical Provider, MD  lactulose, encephalopathy, (ENULOSE) 10 GM/15ML SOLN Take 30 g by mouth daily. For constipation    Historical Provider, MD  memantine (NAMENDA) 10 MG tablet Take 10 mg by mouth 2 (two) times daily. For dementia    Historical Provider, MD  mirtazapine (REMERON) 15 MG tablet Take 15 mg by mouth at bedtime. For depression    Historical Provider, MD  morphine (MS CONTIN) 30 MG 12 hr tablet Take one tablet by mouth twice daily. Do not crush 03/20/14   Tiffany L Reed, DO  Multiple Vitamins-Minerals (DECUBI-VITE) CAPS Take 1 capsule by mouth daily. For wound healing    Historical Provider, MD  omeprazole (PRILOSEC) 20 MG capsule Take 20 mg by mouth at bedtime. For GERD    Historical Provider, MD  OXYGEN-HELIUM IN Inhale 2 L into the lungs as needed. For SOB and SAT >88%    Historical Provider, MD  polyethylene glycol (MIRALAX / GLYCOLAX) packet Take 17 g by mouth daily. 01/07/14   Elnora Morrison, MD  Sennosides-Docusate Sodium (SENNA-S PO) Take 2 tablets by mouth at bedtime.  Historical Provider, MD  UNABLE TO FIND Lorazepam 0.5mg /ml gel apply transdermally BID PRN, and 0.5mg /ml gel transdermally Q D    Historical Provider, MD   BP 159/74 mmHg  Pulse 87  Resp 18  SpO2 98%  Vital signs normal   Physical Exam  Constitutional:  Non-toxic appearance. She does not appear ill. She appears distressed.  Frail elderly female who is in fetal position.  HENT:  Head: Normocephalic and atraumatic.  Right Ear: External ear normal.  Left Ear: External ear normal.  Nose: Nose normal. No mucosal edema or rhinorrhea.  Mouth/Throat: Oropharynx is clear and moist and mucous membranes are normal. No dental abscesses or uvula swelling.  Patient is vomiting coffee ground/red  material.  Eyes: Conjunctivae and EOM are normal. Pupils are equal, round, and reactive to light.  Neck: Normal range of motion and full passive range of motion without pain. Neck supple.  Cardiovascular: Normal rate, regular rhythm and normal heart sounds.  Exam reveals no gallop and no friction rub.   No murmur heard. Pulmonary/Chest: Effort normal and breath sounds normal. No respiratory distress. She has no wheezes. She has no rhonchi. She has no rales. She exhibits no tenderness and no crepitus.  Abdominal: Soft. Normal appearance and bowel sounds are normal. She exhibits no distension. There is no tenderness. There is no rebound and no guarding.  Musculoskeletal: Normal range of motion. She exhibits no edema or tenderness.  Moves all extremities well.   Neurological: She is alert. She has normal strength. No cranial nerve deficit.  Skin: Skin is warm, dry and intact. No rash noted. No erythema. No pallor.  Psychiatric: Her mood appears not anxious.  Patient does not interact well  Nursing note and vitals reviewed.   ED Course  Procedures (including critical care time)  Medications  pantoprazole (PROTONIX) 80 mg in sodium chloride 0.9 % 250 mL (0.32 mg/mL) infusion (8 mg/hr Intravenous New Bag/Given 09/10/14 0647)  pantoprazole (PROTONIX) injection 40 mg (not administered)  pantoprazole (PROTONIX) injection 40 mg (40 mg Intravenous Given 09/10/14 0641)  0.9 %  sodium chloride infusion ( Intravenous New Bag/Given 09/10/14 7829)   Patient was started on a Protonix bolus and drip for her upper GI bleed. Patient continues to be vomiting small amounts of coffee grounds and bright red blood.  07:09 Dr Nelda Marseille states to have hospitalist admit, not a ICU candidate  07:12 Dr Collene Mares, GI, will see patient.  07:22 PA M York, admit to step down   Labs Review Results for orders placed or performed during the hospital encounter of 09/10/14  Comprehensive metabolic panel  Result Value Ref Range    Sodium 148 (H) 135 - 145 mmol/L   Potassium 3.2 (L) 3.5 - 5.1 mmol/L   Chloride 103 96 - 112 mmol/L   CO2 33 (H) 19 - 32 mmol/L   Glucose, Bld 211 (H) 70 - 99 mg/dL   BUN 35 (H) 6 - 23 mg/dL   Creatinine, Ser 1.13 (H) 0.50 - 1.10 mg/dL   Calcium 9.9 8.4 - 10.5 mg/dL   Total Protein 6.9 6.0 - 8.3 g/dL   Albumin 3.4 (L) 3.5 - 5.2 g/dL   AST 34 0 - 37 U/L   ALT 19 0 - 35 U/L   Alkaline Phosphatase 35 (L) 39 - 117 U/L   Total Bilirubin 0.5 0.3 - 1.2 mg/dL   GFR calc non Af Amer 40 (L) >90 mL/min   GFR calc Af Amer 46 (L) >90 mL/min   Anion  gap 12 5 - 15  CBC with Differential  Result Value Ref Range   WBC 14.4 (H) 4.0 - 10.5 K/uL   RBC 4.03 3.87 - 5.11 MIL/uL   Hemoglobin 12.1 12.0 - 15.0 g/dL   HCT 38.0 36.0 - 46.0 %   MCV 94.3 78.0 - 100.0 fL   MCH 30.0 26.0 - 34.0 pg   MCHC 31.8 30.0 - 36.0 g/dL   RDW 14.0 11.5 - 15.5 %   Platelets 413 (H) 150 - 400 K/uL   Neutrophils Relative % 86 (H) 43 - 77 %   Neutro Abs 12.4 (H) 1.7 - 7.7 K/uL   Lymphocytes Relative 8 (L) 12 - 46 %   Lymphs Abs 1.1 0.7 - 4.0 K/uL   Monocytes Relative 6 3 - 12 %   Monocytes Absolute 0.8 0.1 - 1.0 K/uL   Eosinophils Relative 0 0 - 5 %   Eosinophils Absolute 0.0 0.0 - 0.7 K/uL   Basophils Relative 0 0 - 1 %   Basophils Absolute 0.0 0.0 - 0.1 K/uL  APTT  Result Value Ref Range   aPTT 30 24 - 37 seconds  Protime-INR  Result Value Ref Range   Prothrombin Time 13.6 11.6 - 15.2 seconds   INR 1.03 0.00 - 1.49  Occult bld gastric/duodenum (cup to lab)  Result Value Ref Range   pH, Gastric NOT DONE    Occult Blood, Gastric POSITIVE (A) NEGATIVE  I-Stat CG4 Lactic Acid, ED  Result Value Ref Range   Lactic Acid, Venous 3.68 (HH) 0.5 - 2.0 mmol/L   Comment NOTIFIED PHYSICIAN   Sample to Blood Bank  Result Value Ref Range   Blood Bank Specimen SAMPLE AVAILABLE FOR TESTING    Sample Expiration 09/11/2014    Laboratory interpretation all normal except elevated LA, + gastrooccult for blood,  leukocytosis, hypokalemia, hypernatremia, renal insuffic     Imaging Review Dg Chest Portable 1 View  09/10/2014   CLINICAL DATA:  Hematemesis.  EXAM: PORTABLE CHEST - 1 VIEW  COMPARISON:  03/19/2009  FINDINGS: Patient is rotated to the right. Cardiomegaly is unchanged. Dense atherosclerosis of the thoracic aorta. Lungs remain hyperinflated but clear. There is no consolidation, pleural effusion, or pneumothorax. The patient's chin obscures the left lung apex.  IMPRESSION: Stable cardiomegaly.  No acute pulmonary process.   Electronically Signed   By: Jeb Levering M.D.   On: 09/10/2014 06:22   Dg Abd Portable 2v  09/10/2014   CLINICAL DATA:  81 year old female with hematemesis.  EXAM: PORTABLE ABDOMEN - 2 VIEW  COMPARISON:  CT 07/13/2010  FINDINGS: Findings consistent with fecal impaction with stool ball distending the rectum. No small bowel dilatation. No gross free intra-abdominal air, however evaluation is limited by technique. No radiopaque calculi are seen. Left hip prosthesis in place.  IMPRESSION: Findings consistent with fecal impaction. No bowel obstruction or free air.   Electronically Signed   By: Jeb Levering M.D.   On: 09/10/2014 06:21     EKG Interpretation None      MDM   Final diagnoses:  Hematemesis with nausea  Hypernatremia  Hypokalemia  Renal insufficiency    Disposition admission  Rolland Porter, MD, FACEP   CRITICAL CARE Performed by: Rolland Porter L Total critical care time: 31 min Critical care time was exclusive of separately billable procedures and treating other patients. Critical care was necessary to treat or prevent imminent or life-threatening deterioration. Critical care was time spent personally by me on the following activities: development of  treatment plan with patient and/or surrogate as well as nursing, discussions with consultants, evaluation of patient's response to treatment, examination of patient, obtaining history from patient or  surrogate, ordering and performing treatments and interventions, ordering and review of laboratory studies, ordering and review of radiographic studies, pulse oximetry and re-evaluation of patient's condition.     Rolland Porter, MD 09/10/14 [phone]

## 2014-09-10 NOTE — ED Notes (Signed)
Dr Tomi Bamberger given a copy of lactic acid results 3.68

## 2014-09-10 NOTE — ED Notes (Signed)
Lab results given to Dr. Knapp  

## 2014-09-10 NOTE — ED Notes (Signed)
Called MD York with Critical Lactic of 5.9.

## 2014-09-10 NOTE — ED Notes (Signed)
Pt arrives from [facility] via [facility]. Pt has been vomiting large amounts of bloody emesis that started this morning.

## 2014-09-11 ENCOUNTER — Encounter (HOSPITAL_COMMUNITY): Payer: Self-pay | Admitting: *Deleted

## 2014-09-11 DIAGNOSIS — K922 Gastrointestinal hemorrhage, unspecified: Secondary | ICD-10-CM | POA: Diagnosis not present

## 2014-09-11 MED ORDER — AMBULATORY NON FORMULARY MEDICATION: Status: AC

## 2014-09-11 MED ORDER — ACETAMINOPHEN 325 MG PO TABS: 650.0000 mg | ORAL_TABLET | Freq: Four times a day (QID) | ORAL | Status: AC | PRN

## 2014-09-11 NOTE — Clinical Social Work Note (Signed)
CSW received phone call from bedside nurse saying patient has discharge orders from the physician.  CSW contacted [facility] to inform the facility that patient will be discharged today.  CSW contacted patient's son Joneen Caraway to inform him that patient will be discharging back to [facility] today.  Son is in agreement to discharging back to [facility], physician recommending hospice services at [facility].  Patient to be d/c'ed today to [facility], family agreeable to plans will transport via ems RN to call report.  Evette Cristal, MSW, [place]

## 2014-09-11 NOTE — Clinical Social Work Note (Signed)
CSW received consult for patient to discharge back to [facility] with hospice.  CSW contacted patient's son Joneen Caraway  to inform him that [facility] said they can take patient back and have hospice involved.  CSW notified nurse to inform her that [facility] can take patient back once she is discharged by the physician.  Patient's son requested to be notified once she is ready to discharge.  CSW placed FL2 on chart to be signed by physician.  Jones Broom. [place], MSW, [place] 09/11/2014 1:36 PM

## 2014-09-11 NOTE — Progress Notes (Signed)
UR completed 

## 2014-09-11 NOTE — Progress Notes (Signed)
Nutrition Brief Note  Chart reviewed. Pt now transitioning to comfort care.  No further nutrition interventions warranted at this time.  Please re-consult as needed.   [name] A. Enrique Weiss, RD, LDN, CDE Pager: [phone] After hours Pager: [phone]  

## 2014-09-11 NOTE — Discharge Summary (Signed)
[facility]  Physician Discharge Summary   Patient ID: [name] MRN: [id number] DOB/AGE: [birth year] 81 y.o.  Admit date: 09/10/2014 Discharge date: 09/11/2014  DISCHARGE DIAGNOSES:  Principal Problem:   Upper GI bleed Active Problems:   Alzheimer's disease   Chronic pain   Hypothermia   Hematemesis   Lactic acidosis   Hypertensive urgency   Fecal impaction   RECOMMENDATIONS FOR OUTPATIENT FOLLOW UP: 1. Patient is full comfort care. 2. She needs hospice services at the skilled nursing facility 3. Avoid further hospitalization.  DISCHARGE CONDITION: poor  Diet recommendation: Comfort feeding  Filed Weights   09/10/14 1516  Weight: 37.4 kg (82 lb 7.2 oz)    INITIAL HISTORY: [name] is a 81 y.o. female, with a pmh of severe alzheimer's dementia, barretts esophagus, intrathoracic stomach, anxiety, depression, failure to thrive, DM, CAD, COPD, who was brought to the ER from [facility] for vomiting blood.The patient was unable to give any history.In the ER she was found to be hypothermic with a temp of 96.5, acidotic with a lactic acid of 5.9, and hypertensive at 179/123. She was subsequent admitted to the hospital  Consultations:  Palliative medicine  Phone consultation with gastroenterology  Procedures:  None  HOSPITAL COURSE:  Upper GI bleed Patient was admitted to the hospital due to upper GI bleeding. Discussions were held with gastroenterology and the patient's family including her son and daughter. Patient was already a DO NOT RESUSCITATE. Per gastroenterology she was not a candidate for any aggressive testing or treatment. Her family agreed. Considering her overall status she was thought to be ideal for comfort care. Patient was admitted to the hospital as it initially appeared that she might deteriorate quickly. However, she stabilized. Her bleeding stopped. Blood pressure, heart rate remained stable. She was seen a palliative  medicine today and the recommend that she go back to skilled nursing facility with hospice services. They discussed with the patient's family and they are agreeable. I also called the patient's son and he is agreeable with this plan.  Patient will be discharged today back to [facility] with hospice services. She is full comfort care. Avoid rehospitalization.  PERTINENT LABS:  The results of significant diagnostics from this hospitalization (including imaging, microbiology, ancillary and laboratory) are listed below for reference.    Microbiology: Recent Results (from the past 240 hour(s))  Blood Culture (routine x 2)     Status: None (Preliminary result)   Collection Time: 09/10/14  7:10 AM  Result Value Ref Range Status   Specimen Description BLOOD LEFT HAND  Final   Special Requests BOTTLES DRAWN AEROBIC ONLY 3CC  Final   Culture   Final           BLOOD CULTURE RECEIVED NO GROWTH TO DATE CULTURE WILL BE HELD FOR 5 DAYS BEFORE ISSUING A FINAL NEGATIVE REPORT Performed at [facility]    Report Status PENDING  Incomplete  Blood Culture (routine x 2)     Status: None (Preliminary result)   Collection Time: 09/10/14  7:55 AM  Result Value Ref Range Status   Specimen Description BLOOD LEFT HAND  Final   Special Requests BOTTLES DRAWN AEROBIC ONLY 5CC  Final   Culture   Final           BLOOD CULTURE RECEIVED NO GROWTH TO DATE CULTURE WILL BE HELD FOR 5 DAYS BEFORE ISSUING A FINAL NEGATIVE REPORT Performed at [facility]    Report Status PENDING  Incomplete  Labs: Basic Metabolic Panel:  Recent Labs Lab 09/10/14 0450  NA 148*  K 3.2*  CL 103  CO2 33*  GLUCOSE 211*  BUN 35*  CREATININE 1.13*  CALCIUM 9.9   Liver Function Tests:  Recent Labs Lab 09/10/14 0450  AST 34  ALT 19  ALKPHOS 35*  BILITOT 0.5  PROT 6.9  ALBUMIN 3.4*    Recent Labs Lab 09/10/14 0752  LIPASE 41   CBC:  Recent Labs Lab 09/10/14 0450  WBC 14.4*  NEUTROABS 12.4*   HGB 12.1  HCT 38.0  MCV 94.3  PLT 413*   IMAGING STUDIES Dg Chest Portable 1 View  09/10/2014   CLINICAL DATA:  Hematemesis.  EXAM: PORTABLE CHEST - 1 VIEW  COMPARISON:  03/19/2009  FINDINGS: Patient is rotated to the right. Cardiomegaly is unchanged. Dense atherosclerosis of the thoracic aorta. Lungs remain hyperinflated but clear. There is no consolidation, pleural effusion, or pneumothorax. The patient's chin obscures the left lung apex.  IMPRESSION: Stable cardiomegaly.  No acute pulmonary process.   Electronically Signed   By: Jeb Levering M.D.   On: 09/10/2014 06:22   Dg Abd Portable 2v  09/10/2014   CLINICAL DATA:  81 year old female with hematemesis.  EXAM: PORTABLE ABDOMEN - 2 VIEW  COMPARISON:  CT 07/13/2010  FINDINGS: Findings consistent with fecal impaction with stool ball distending the rectum. No small bowel dilatation. No gross free intra-abdominal air, however evaluation is limited by technique. No radiopaque calculi are seen. Left hip prosthesis in place.  IMPRESSION: Findings consistent with fecal impaction. No bowel obstruction or free air.   Electronically Signed   By: Jeb Levering M.D.   On: 09/10/2014 06:21    DISCHARGE EXAMINATION: Filed Vitals:   09/10/14 1428 09/10/14 1516 09/11/14 0407 09/11/14 0653  BP: 145/70 148/68  144/55  Pulse: 100 97  111  Temp:  98.1 F (36.7 C)  98.7 F (37.1 C)  TempSrc:  Oral  Oral  Resp: 16 19  18   Height:   5\' 5"  (1.651 m)   Weight:  37.4 kg (82 lb 7.2 oz)    SpO2: 98% 97%  93%   General appearance: alert, combative, distracted and no distress Resp: clear to auscultation bilaterally Cardio: regular rate and rhythm, S1, S2 normal, no murmur, click, rub or gallop GI: soft, non-tender; bowel sounds normal; no masses,  no organomegaly  DISPOSITION: SNF with Hospice  Discharge Instructions    Discharge instructions    Complete by:  As directed   Patient needs to be followed by Hospice at the SNF. She is full comfort  care. Avoid hospitalization. Comfort feeds. Activity as tolerated.           ALLERGIES:  Allergies  Allergen Reactions  . Asa [Aspirin]      Current Discharge Medication List    START taking these medications   Details  acetaminophen (TYLENOL) 325 MG tablet Take 2 tablets (650 mg total) by mouth every 6 (six) hours as needed for mild pain (or Fever >/= 101).      CONTINUE these medications which have CHANGED   Details  AMBULATORY NON FORMULARY MEDICATION Lorazepam 0.5mg /ml Gel Sig: Apply 18ml topically daily; Administer 1ml topically times 1 as needed between 10pm and 7am only Qty: 30 mL, Refills: 5      CONTINUE these medications which have NOT CHANGED   Details  fluticasone (FLONASE) 50 MCG/ACT nasal spray Place 2 sprays into both nostrils daily.    lactulose, encephalopathy, (ENULOSE) 10  GM/15ML SOLN Take 30 g by mouth daily. For constipation    mirtazapine (REMERON) 15 MG tablet Take 15 mg by mouth at bedtime. For depression    omeprazole (PRILOSEC) 20 MG capsule Take 20 mg by mouth at bedtime. For GERD    OXYGEN-HELIUM IN Inhale 2 L into the lungs as needed. For SOB and SAT >88%    polyethylene glycol (MIRALAX / GLYCOLAX) packet Take 17 g by mouth daily. Qty: 14 each, Refills: 0    Protein (PROCEL) POWD Take 1 scoop by mouth 2 (two) times daily.    Sennosides-Docusate Sodium (SENNA-S PO) Take 2 tablets by mouth at bedtime.    Artificial Tear Ointment (AKWA TEARS OP) Apply 2 drops to eye 3 (three) times daily. Instill  2 drops into each eye three times daily as needed for dryness/irritation to eyes. *Wait 5 minutes between 2 eye meds*      STOP taking these medications     memantine (NAMENDA) 10 MG tablet      Multiple Vitamins-Minerals (DECUBI-VITE) CAPS      UNABLE TO FIND          TOTAL DISCHARGE TIME: 35 mins  [facility] Pager [phone]  09/11/2014, 2:03 PM

## 2014-09-11 NOTE — Consult Note (Signed)
Palliative Medicine Team at [facility]  Date: 09/11/2014   Patient Name: [name]  DOB: 10/07/1915  MRN: [id number]  Age / Sex: 81 y.o., female   PCP: No primary care provider on file. Referring Physician: Bonnielee Haff, MD  Active Problems: Principal Problem:   Upper GI bleed Active Problems:   Alzheimer's disease   Chronic pain   Hypothermia   Hematemesis   Lactic acidosis   Hypertensive urgency   Fecal impaction   HPI/Reason for Consultation: [name] is a 81 y.o. female , with a pmh of severe alzheimer's dementia, barretts esophagus, intrathoracic stomach, anxiety, depression, failure to thrive, DM, CAD, COPD, and BRCA who was brought to the ER from [facility] for vomiting blood. The patient is unable to give any history. She is cachectically thin, frail, and remains in the fetal position spitting about 20 cc of brown liquid from her mouth occasionally. In the ER she is found to be hypothermic with a temp of 96.5, acidotic with a lactic acid of 5.9, and hypertensive at 179/123. She has a fecal impaction on xray. I have discussed her situation with both her son [name]) and her daughter [name]). Her son states that she has been a DNR for a number of years and he has told [name] and multiple medical professionals taking care of her not to let her suffer. I discussed instituting comfort measures only with him and he was in agreement with doing so. I expressed to both [name] and [name] that their mother may not survive this hospitalization.  Palliative Care consulted to assist with disposition - possible [employer] [facility]. Patient is an established patient with Hospice.  Participants in Discussion: HCPOA: Yes - son   Advance Directive:    Code Status Orders        Start     Ordered   09/10/14 1504  Do not attempt resuscitation (DNR)   Continuous    Question Answer Comment  In the event of cardiac or respiratory ARREST Do not call a  "code blue"   In the event of cardiac or respiratory ARREST Do not perform Intubation, CPR, defibrillation or ACLS   In the event of cardiac or respiratory ARREST Use medication by any route, position, wound care, and other measures to relive pain and suffering. May use oxygen, suction and manual treatment of airway obstruction as needed for comfort.      09/10/14 1503    Advance Directive Documentation        Most Recent Value   Type of Advance Directive  Out of facility DNR (pink MOST or yellow form)   Pre-existing out of facility DNR order (yellow form or pink MOST form)  Physician notified to receive inpatient order   "MOST" Form in Place?           I have reviewed the medical record, interviewed the patient and family, and examined the patient. The following aspects are pertinent.  Past Medical History  Diagnosis Date  . GERD (gastroesophageal reflux disease)   . Chronic kidney disease     Chronic Stage II  . Hypertension   . Osteoporosis   . Depression   . Myocardial infarction   . Cancer     Breast  . Substance abuse     Alcoholism   . Alzheimer's disease 07/15/2010  . COPD (chronic obstructive pulmonary disease)   . Diabetes mellitus without complication   . Anemia   . FTT (failure to thrive) in  adult   . CAD (coronary artery disease)    History   Social History  . Marital Status: Widowed    Spouse Name: N/A  . Number of Children: 2  . Years of Education: 8th grade   Social History Main Topics  . Smoking status: Current Every Day Smoker  . Smokeless tobacco: No  . Alcohol Use: Not at this time  . Drug Use: No  . Sexual Activity: No   Other Topics Concern  . None   Social History Narrative  No information available - reviewed all records available. Has been at [facility] for some time with hospice consult. Per son - due to patient being stable hospice signed off to return if her condition changed.   History reviewed. No pertinent family  history. Scheduled Meds: . feeding supplement (RESOURCE BREEZE)  1 Container Oral TID BM  . lactulose  30 g Oral Daily  . [START ON 09/13/2014] pantoprazole (PROTONIX) IV  40 mg Intravenous Q12H  . sodium chloride  3 mL Intravenous Q12H   Continuous Infusions: . pantoprozole (PROTONIX) infusion Stopped (09/10/14 1343)   PRN Meds:.acetaminophen **OR** acetaminophen, LORazepam, morphine injection, ondansetron **OR** ondansetron (ZOFRAN) IV, phenol Allergies  Allergen Reactions  . Asa [Aspirin]    CBC:    Component Value Date/Time   WBC 14.4* 09/10/2014 0450   WBC 4.5 12/14/2013   HGB 12.1 09/10/2014 0450   HCT 38.0 09/10/2014 0450   PLT 413* 09/10/2014 0450   MCV 94.3 09/10/2014 0450   NEUTROABS 12.4* 09/10/2014 0450   LYMPHSABS 1.1 09/10/2014 0450   MONOABS 0.8 09/10/2014 0450   EOSABS 0.0 09/10/2014 0450   BASOSABS 0.0 09/10/2014 0450   Comprehensive Metabolic Panel:    Component Value Date/Time   NA 148* 09/10/2014 0450   NA 137 12/14/2013   K 3.2* 09/10/2014 0450   CL 103 09/10/2014 0450   CO2 33* 09/10/2014 0450   BUN 35* 09/10/2014 0450   BUN 35* 12/14/2013   CREATININE 1.13* 09/10/2014 0450   GLUCOSE 211* 09/10/2014 0450   CALCIUM 9.9 09/10/2014 0450   AST 34 09/10/2014 0450   ALT 19 09/10/2014 0450   ALKPHOS 35* 09/10/2014 0450   BILITOT 0.5 09/10/2014 0450   PROT 6.9 09/10/2014 0450   ALBUMIN 3.4* 09/10/2014 0450    Vital Signs: BP 144/55 mmHg  Pulse 111  Temp(Src) 98.7 F (37.1 C) (Oral)  Resp 18  Ht 5' 5"  (1.651 m)  Wt 37.4 kg (82 lb 7.2 oz)  BMI 13.72 kg/m2  SpO2 93% Filed Weights   09/10/14 1516  Weight: 37.4 kg (82 lb 7.2 oz)      Physical Exam:  General Appearance: cachetic white woman in no distress. Arousable, denies pain HEENT: temporal wasting, edentulous Lungs: normal respirations. Lungs CTAP CV: 2+ radial pulse, feet warm. RRR Abdomen: scaphoid, non-tender Extremities very thin Skin: no breakdown heels, sacrum. No skin  lesions Psych: could not assess. Neuro: awake, can verbalize simple words. Does not follow commands.  Summary of Established Goals of Care and Medical Treatment Preferences  Primary Diagnoses:  1. UGI bleed 2. Advanced dementia with inanitition  Active Symptoms: 1. Appears comfortable  Psycho-social/Spiritual:  Could not assess  Prognosis: Very poor   Palliative Performance Scale: dependent for all ADLs  Recommendations:  1. Code Status: DNR comfort care only 2. Scope of Treatment:  Comfort care only - no further lab, no procedures 3. Symptom Management: no active needs 4. Palliative Prophylaxis: stool softeners, relieve impactive 5. Disposition:  return to SNF with hospice consult per son's request. He is satisfied with care provided at [facility]. He understands that her prognosis is terminal - states she "is ready."   Time In: 10:47 Time Out: 11:20 Time Total: 33 min Greater than 50%  of this time was spent counseling and coordinating care related to the above assessment and plan.  Signed by: Adella Hare, MD  Neena Rhymes, MD  09/11/2014, 10:47 AM  Please contact Palliative Medicine Team phone at [phone] for questions and concerns.

## 2014-09-14 ENCOUNTER — Non-Acute Institutional Stay (SKILLED_NURSING_FACILITY): Payer: PRIVATE HEALTH INSURANCE | Admitting: Internal Medicine

## 2014-09-14 ENCOUNTER — Encounter: Payer: Self-pay | Admitting: Internal Medicine

## 2014-09-14 DIAGNOSIS — G309 Alzheimer's disease, unspecified: Secondary | ICD-10-CM | POA: Diagnosis not present

## 2014-09-14 DIAGNOSIS — J309 Allergic rhinitis, unspecified: Secondary | ICD-10-CM

## 2014-09-14 DIAGNOSIS — F411 Generalized anxiety disorder: Secondary | ICD-10-CM | POA: Diagnosis not present

## 2014-09-14 DIAGNOSIS — R627 Adult failure to thrive: Secondary | ICD-10-CM

## 2014-09-14 DIAGNOSIS — F329 Major depressive disorder, single episode, unspecified: Secondary | ICD-10-CM

## 2014-09-14 DIAGNOSIS — F0393 Unspecified dementia, unspecified severity, with mood disturbance: Secondary | ICD-10-CM

## 2014-09-14 DIAGNOSIS — F0280 Dementia in other diseases classified elsewhere without behavioral disturbance: Secondary | ICD-10-CM

## 2014-09-14 DIAGNOSIS — E43 Unspecified severe protein-calorie malnutrition: Secondary | ICD-10-CM | POA: Diagnosis not present

## 2014-09-14 DIAGNOSIS — K5900 Constipation, unspecified: Secondary | ICD-10-CM

## 2014-09-14 NOTE — Progress Notes (Signed)
Patient ID: [name], female   DOB: 1916/05/20, 81 y.o.   MRN: [id number]     [facility]   PCP: No primary care provider on file.  Code Status: DNR  Allergies  Allergen Reactions  . Diona Fanti [Aspirin]     Chief Complaint  Patient presents with  . Readmit To SNF     HPI:  81 year old patient is here for long term care post hospital admission from 09/10/14-09/11/14 with hematemesis from upper gi bleed. GI was consulted over the phone with family. Comfort care was opted in place of invasive measures. Palliative care was consulted and recommended hospice services at SNF. She has PMH of severe alzheimer's dementia, barretts esophagus, intrathoracic stomach, anxiety, depression, failure to thrive, DM, CAD, COPD. She is seen in her room today. She does not participate in conversation, is sleepy.   Review of Systems:  Unable to obtain   Past Medical History  Diagnosis Date  . GERD (gastroesophageal reflux disease)   . Chronic kidney disease     Chronic Stage II  . Hypertension   . Osteoporosis   . Depression   . Myocardial infarction   . Cancer     Breast  . Substance abuse     Alcoholism   . Alzheimer's disease 07/15/2010  . COPD (chronic obstructive pulmonary disease)   . Diabetes mellitus without complication   . Anemia   . FTT (failure to thrive) in adult   . CAD (coronary artery disease)    Past Surgical History  Procedure Laterality Date  . Hernia repair     Social History:   reports that she has been smoking.  She does not have any smokeless tobacco history on file. Her alcohol and drug histories are not on file.  History reviewed. No pertinent family history.  Medications: Patient's Medications  New Prescriptions   No medications on file  Previous Medications   ACETAMINOPHEN (TYLENOL) 325 MG TABLET    Take 2 tablets (650 mg total) by mouth every 6 (six) hours as needed for mild pain (or Fever >/= 101).   AMBULATORY  NON FORMULARY MEDICATION    Lorazepam 0.5mg /ml Gel Sig: Apply 19ml topically daily; Administer 37ml topically times 1 as needed between 10pm and 7am only   ARTIFICIAL TEAR OINTMENT (AKWA TEARS OP)    Apply 2 drops to eye 3 (three) times daily. Instill  2 drops into each eye three times daily as needed for dryness/irritation to eyes. *Wait 5 minutes between 2 eye meds*   FLUTICASONE (FLONASE) 50 MCG/ACT NASAL SPRAY    Place 2 sprays into both nostrils daily.   LACTULOSE, ENCEPHALOPATHY, (ENULOSE) 10 GM/15ML SOLN    Take 30 g by mouth daily. For constipation   MIRTAZAPINE (REMERON) 15 MG TABLET    Take 15 mg by mouth at bedtime. For depression   OMEPRAZOLE (PRILOSEC) 20 MG CAPSULE    Take 20 mg by mouth at bedtime. For GERD   OXYGEN-HELIUM IN    Inhale 2 L into the lungs as needed. For SOB and SAT >88%   POLYETHYLENE GLYCOL (MIRALAX / GLYCOLAX) PACKET    Take 17 g by mouth daily.   PROTEIN (PROCEL) POWD    Take 1 scoop by mouth 2 (two) times daily.   SENNOSIDES-DOCUSATE SODIUM (SENNA-S PO)    Take 2 tablets by mouth at bedtime.  Modified Medications   No medications on file  Discontinued Medications   No medications on file  Physical Exam: Filed Vitals:   09/14/14 1314  BP: 136/88  Pulse: 88  Temp: 97 F (36.1 C)  Resp: 18  SpO2: 96%    General- elderly female, frail, thin built, in no acute distress, arousable Head- normocephalic, atraumatic Eyes- no pallor, no icterus, no discharge Neck- no cervical lymphadenopathy Cardiovascular- normal s1,s2, no murmurs, no leg edema Respiratory- bilateral clear to auscultation, no wheeze, no rhonchi, no crackles, no use of accessory muscles Abdomen- bowel sounds present, soft, non tender Musculoskeletal- generalized weakness, muscle wasting Skin- warm and dry Neuro- cannot assess as arousable but not following commands   Labs reviewed: Basic Metabolic Panel:  Recent Labs  12/14/13 01/07/14 0528 09/10/14 0450  NA 137 134* 148*  K  4.3 4.2 3.2*  CL  --  99 103  CO2  --   --  33*  GLUCOSE  --  84 211*  BUN 35* 54* 35*  CREATININE  --  1.80* 1.13*  CALCIUM  --   --  9.9   Liver Function Tests:  Recent Labs  12/14/13 09/10/14 0450  AST 15 34  ALT 6* 19  ALKPHOS  --  35*  BILITOT  --  0.5  PROT  --  6.9  ALBUMIN  --  3.4*    Recent Labs  09/10/14 0752  LIPASE 41   No results for input(s): AMMONIA in the last 8760 hours. CBC:  Recent Labs  12/14/13 01/07/14 0528 09/10/14 0450  WBC 4.5  --  14.4*  NEUTROABS  --   --  12.4*  HGB 9.0* 11.6* 12.1  HCT 30* 34.0* 38.0  MCV  --   --  94.3  PLT 218  --  413*    Assessment/Plan  Protein calorie malnutrition With her severe dementia, decline anticipated. Continue procel and mechanical soft diet. Continue mighty shakes. Hospice consult to be placed. On remeron at present for depression and appetite stimulation. After hospice team sees the patient , will consider to take her off remeron  Failure to thrive With dementia and protein calorie malnutrition. Decline anticipated. Hospice consult  alzhemier's disease decline anticipated. Assistance with ADLS, pressure ulcer and fall precuations  Anxiety Continue ativan gel current regimen  Allergic rhinitis On flonase, currently asymptomatic,d/c flonaseGoals of care: comfort care, do not hospitalize  Constipation Continue senna s daily with lactulose and miralax  Depression Unable to assess her mood, has severe dementia, continue remeron 15 mg daily  Upper gi bleed Stable, family wants comfort care, avoid lab work until necessary, continue prilosec 20 mg daily   Labs/tests ordered: none  Family/ staff Communication: reviewed care plan with staff   Blanchie Serve, MD  [facility] [phone] (Monday-Friday 8 am - 5 pm) [phone] (afterhours)

## 2014-09-16 LAB — CULTURE, BLOOD (ROUTINE X 2)
Culture: NO GROWTH
Culture: NO GROWTH

## 2014-10-02 ENCOUNTER — Other Ambulatory Visit: Payer: Self-pay | Admitting: *Deleted

## 2014-10-02 MED ORDER — LORAZEPAM 2 MG/ML IJ SOLN: INTRAMUSCULAR | Status: DC

## 2014-11-07 ENCOUNTER — Encounter: Payer: Self-pay | Admitting: Adult Health

## 2014-11-07 ENCOUNTER — Non-Acute Institutional Stay (SKILLED_NURSING_FACILITY): Payer: PRIVATE HEALTH INSURANCE | Admitting: Adult Health

## 2014-11-07 DIAGNOSIS — F0280 Dementia in other diseases classified elsewhere without behavioral disturbance: Secondary | ICD-10-CM | POA: Diagnosis not present

## 2014-11-07 DIAGNOSIS — F411 Generalized anxiety disorder: Secondary | ICD-10-CM | POA: Diagnosis not present

## 2014-11-07 DIAGNOSIS — K922 Gastrointestinal hemorrhage, unspecified: Secondary | ICD-10-CM

## 2014-11-07 DIAGNOSIS — E43 Unspecified severe protein-calorie malnutrition: Secondary | ICD-10-CM

## 2014-11-07 DIAGNOSIS — R627 Adult failure to thrive: Secondary | ICD-10-CM

## 2014-11-07 DIAGNOSIS — K5900 Constipation, unspecified: Secondary | ICD-10-CM | POA: Diagnosis not present

## 2014-11-07 DIAGNOSIS — G309 Alzheimer's disease, unspecified: Secondary | ICD-10-CM

## 2014-11-07 NOTE — Progress Notes (Signed)
Patient ID: [name], female   DOB: 1915-11-17, 81 y.o.   MRN: [id number]   11/07/2014  Facility:  [facility] Home Location:  [place] Room Number: [phone] LEVEL OF CARE:  SNF (31)   Chief Complaint  Patient presents with  . Medical Management of Chronic Issues    Alzheimer's dementia with agitation, anxiety, protein calorie malnutrition, constipation, upper GI bleed and failure to thrive    HISTORY OF PRESENT ILLNESS:  This is a 81 year old female who is a long-term resident of [facility]. She has been agitated at times so her Risperidone was increased. She has advanced Alzheimer's dementia. Latest weight is 77.2 lbs. She has poor po intake. Latest albumin 3.3. She is currently on protein supplement for protein-calorie malnutrition. No episode of GI Bleed and currently on Prilosec.  PAST MEDICAL HISTORY:  Past Medical History  Diagnosis Date  . GERD (gastroesophageal reflux disease)   . Chronic kidney disease     Chronic Stage II  . Hypertension   . Osteoporosis   . Depression   . Myocardial infarction   . Cancer     Breast  . Substance abuse     Alcoholism   . Alzheimer's disease 07/15/2010  . COPD (chronic obstructive pulmonary disease)   . Diabetes mellitus without complication   . Anemia   . FTT (failure to thrive) in adult   . CAD (coronary artery disease)     CURRENT MEDICATIONS: Reviewed per MAR/see medication list  Allergies  Allergen Reactions  . Asa [Aspirin]      REVIEW OF SYSTEMS: not able to obtain due to advanced Alzheimer's Dementia.  PHYSICAL EXAMINATION  GENERAL: no acute distress EYES: conjunctivae normal, sclerae normal, normal eye lids NECK: supple, trachea midline, no neck masses, no thyroid tenderness, no thyromegaly LYMPHATICS: no LAN in the neck, no supraclavicular LAN RESPIRATORY: breathing is even & unlabored, BS CTAB CARDIAC: RRR, no murmur,no extra heart sounds, no edema GI: abdomen soft,  normal BS, no masses, no tenderness, no hepatomegaly, no splenomegaly EXTREMITIES: able to move X 4 extremities; moves around with wheelchair PSYCHIATRIC: disoriented to time and place  LABS/RADIOLOGY: Labs reviewed: 09/01/14  WBC poor 0.1 hemoglobin 9.8 hematocrit 30.3 MCV 92.4 sodium 140 potassium 4.1 glucose 128 BUN 42 creatinine 1.15 total bilirubin 0.3 alkaline phosphatase 35 SGOT 22 SGPT 58 total protein 5.4 albumin 3.3 calcium 9.2 Basic Metabolic Panel:  Recent Labs  01/07/14 0528 09/01/14 09/10/14 0450  NA 134* 140 148*  K 4.2 4.1 3.2*  CL 99  --  103  CO2  --   --  33*  GLUCOSE 84  --  211*  BUN 54* 42* 35*  CREATININE 1.80* 1.2* 1.13*  CALCIUM  --   --  9.9   Liver Function Tests:  Recent Labs  12/14/13 09/01/14 09/10/14 0450  AST 15 22 34  ALT 6* 58* 19  ALKPHOS  --  35 35*  BILITOT  --   --  0.5  PROT  --   --  6.9  ALBUMIN  --   --  3.4*    Recent Labs  09/10/14 0752  LIPASE 41    CBC:  Recent Labs  12/14/13 01/07/14 0528 09/01/14 09/10/14 0450  WBC 4.5  --  4.1 14.4*  NEUTROABS  --   --   --  12.4*  HGB 9.0* 11.6* 9.8* 12.1  HCT 30* 34.0* 30* 38.0  MCV  --   --   --  94.3  PLT 218  --  223 413*     ASSESSMENT/PLAN:  Alzheimer's dementia with agitation - advanced; continue risperidone 0.25 mg 1 tab by mouth twice a day Anxiety - continue Ativan 0.5 mg/ml gel transdermally BID PRN Protein-calorie malnutrition, severe - albumin 3.3; continue protein-supllementation Constipation - continue Lactulose 30gm/45 ml PO Q D, Miralax 17 gm PO Q D and Senna-S 2 tabs PO Q HS Upper GI Bleed - continue Prilosec 20 mg 1 capsule PO Q HS Failure to thrive - has palliative consult with [facility]   Goals of care:  Long-term care    [facility], [facility] [phone]

## 2014-11-17 ENCOUNTER — Encounter: Payer: Self-pay | Admitting: Adult Health

## 2014-11-17 ENCOUNTER — Non-Acute Institutional Stay (SKILLED_NURSING_FACILITY): Payer: PRIVATE HEALTH INSURANCE | Admitting: Adult Health

## 2014-11-17 DIAGNOSIS — E43 Unspecified severe protein-calorie malnutrition: Secondary | ICD-10-CM

## 2014-11-17 DIAGNOSIS — F419 Anxiety disorder, unspecified: Secondary | ICD-10-CM | POA: Diagnosis not present

## 2014-11-17 DIAGNOSIS — R627 Adult failure to thrive: Secondary | ICD-10-CM | POA: Diagnosis not present

## 2014-11-17 DIAGNOSIS — F411 Generalized anxiety disorder: Secondary | ICD-10-CM | POA: Diagnosis not present

## 2014-11-17 DIAGNOSIS — K5900 Constipation, unspecified: Secondary | ICD-10-CM | POA: Diagnosis not present

## 2014-11-17 DIAGNOSIS — F0280 Dementia in other diseases classified elsewhere without behavioral disturbance: Secondary | ICD-10-CM

## 2014-11-17 DIAGNOSIS — K922 Gastrointestinal hemorrhage, unspecified: Secondary | ICD-10-CM | POA: Diagnosis not present

## 2014-11-17 DIAGNOSIS — G309 Alzheimer's disease, unspecified: Secondary | ICD-10-CM | POA: Diagnosis not present

## 2014-11-17 NOTE — Progress Notes (Signed)
Patient ID: [name], female   DOB: [birth year], 81 y.o.   MRN: [id number]   11/17/2014  Facility:  Nursing Home Location:  [place] Room Number: [phone] LEVEL OF CARE:  SNF (31)   Chief Complaint  Patient presents with  . Discharge Note    Alzheimer's dementia with agitation, anxiety, protein calorie malnutrition, constipation, upper GI bleed and failure to thrive    HISTORY OF PRESENT ILLNESS:  This is a 81 year old female who is for discharge to [facility]. She has been recently re-admitted to [facility] on 09/11/14 from [facility]. She had been a long-term resident @ [facility] since 02/28/08. Patient has Alzheimer's dementia. She uses her wheelchair to move around the facility and would sometimes talk in a loud voice asking for coffee. She has been agitated at times so her Risperidone was recently increased. She has advanced Alzheimer's dementia. Latest weight is 77.2 lbs. She has poor po intake. Latest albumin 3.3. She is currently on protein supplement for protein-calorie malnutrition. No episode of GI Bleed and currently on Prilosec.  She is being discharged to another SNF for appropriate placement.  PAST MEDICAL HISTORY:  Past Medical History  Diagnosis Date  . GERD (gastroesophageal reflux disease)   . Chronic kidney disease     Chronic Stage II  . Hypertension   . Osteoporosis   . Depression   . Myocardial infarction   . Cancer     Breast  . Substance abuse     Alcoholism   . Alzheimer's disease 07/15/2010  . COPD (chronic obstructive pulmonary disease)   . Diabetes mellitus without complication   . Anemia   . FTT (failure to thrive) in adult   . CAD (coronary artery disease)     CURRENT MEDICATIONS: Reviewed per MAR/see medication list  Allergies  Allergen Reactions  . Asa [Aspirin]      REVIEW OF SYSTEMS: not able to obtain due to advanced Alzheimer's  Dementia.  PHYSICAL EXAMINATION  GENERAL: no acute distress NECK: supple, trachea midline, no neck masses, no thyroid tenderness, no thyromegaly LYMPHATICS: no LAN in the neck, no supraclavicular LAN RESPIRATORY: breathing is even & unlabored, BS CTAB CARDIAC: RRR, no murmur,no extra heart sounds, no edema GI: abdomen soft, normal BS, no masses, no tenderness, no hepatomegaly, no splenomegaly EXTREMITIES: able to move X 4 extremities; moves around with wheelchair PSYCHIATRIC: disoriented to time and place  LABS/RADIOLOGY: Labs reviewed: 09/01/14  WBC poor 0.1 hemoglobin 9.8 hematocrit 30.3 MCV 92.4 sodium 140 potassium 4.1 glucose 128 BUN 42 creatinine 1.15 total bilirubin 0.3 alkaline phosphatase 35 SGOT 22 SGPT 58 total protein 5.4 albumin 3.3 calcium 9.2 Basic Metabolic Panel:  Recent Labs  01/07/14 0528 09/01/14 09/10/14 0450  NA 134* 140 148*  K 4.2 4.1 3.2*  CL 99  --  103  CO2  --   --  33*  GLUCOSE 84  --  211*  BUN 54* 42* 35*  CREATININE 1.80* 1.2* 1.13*  CALCIUM  --   --  9.9   Liver Function Tests:  Recent Labs  12/14/13 09/01/14 09/10/14 0450  AST 15 22 34  ALT 6* 58* 19  ALKPHOS  --  35 35*  BILITOT  --   --  0.5  PROT  --   --  6.9  ALBUMIN  --   --  3.4*    Recent Labs  09/10/14 0752  LIPASE 41  CBC:  Recent Labs  12/14/13 01/07/14 0528 09/01/14 09/10/14 0450  WBC 4.5  --  4.1 14.4*  NEUTROABS  --   --   --  12.4*  HGB 9.0* 11.6* 9.8* 12.1  HCT 30* 34.0* 30* 38.0  MCV  --   --   --  94.3  PLT 218  --  223 413*     ASSESSMENT/PLAN:  Alzheimer's dementia with agitation - advanced; continue risperidone 0.25 mg 1 tab by mouth twice a day Anxiety - continue Ativan 0.5 mg/0.15ml PO BID  and BID PRN Protein-calorie malnutrition, severe - albumin 3.3; continue protein-supllementation Constipation - continue Lactulose 30gm/45 ml PO Q D, Miralax 17 gm PO Q D and Senna-S 2 tabs PO Q HS Upper GI Bleed - continue Prilosec 20 mg 1 capsule PO Q  HS Failure to thrive - has palliative consult with [facility]    I have filled out patient's discharge paperwork.   Total discharge time: Less than 30 minutes  Discharge time involved coordination of the discharge process with social worker, andnursing staff.      [facility], NP [employer] [phone]

## 2016-01-15 DEATH — deceased
